# Patient Record
Sex: Female | Born: 1990 | Race: White | Hispanic: No | Marital: Married | Smoking: Never smoker
Health system: Southern US, Community
[De-identification: ages and names within clinical notes are randomized; demographics above are authoritative.]

## PROBLEM LIST (undated history)

## (undated) DIAGNOSIS — F32A Depression, unspecified: Secondary | ICD-10-CM

## (undated) DIAGNOSIS — R51 Headache: Secondary | ICD-10-CM

## (undated) DIAGNOSIS — K219 Gastro-esophageal reflux disease without esophagitis: Secondary | ICD-10-CM

## (undated) DIAGNOSIS — F419 Anxiety disorder, unspecified: Secondary | ICD-10-CM

## (undated) DIAGNOSIS — N83209 Unspecified ovarian cyst, unspecified side: Secondary | ICD-10-CM

## (undated) DIAGNOSIS — R112 Nausea with vomiting, unspecified: Secondary | ICD-10-CM

## (undated) DIAGNOSIS — Z9889 Other specified postprocedural states: Secondary | ICD-10-CM

## (undated) DIAGNOSIS — N809 Endometriosis, unspecified: Secondary | ICD-10-CM

## (undated) DIAGNOSIS — IMO0002 Reserved for concepts with insufficient information to code with codable children: Secondary | ICD-10-CM

## (undated) DIAGNOSIS — Z915 Personal history of self-harm: Secondary | ICD-10-CM

## (undated) DIAGNOSIS — F341 Dysthymic disorder: Secondary | ICD-10-CM

## (undated) DIAGNOSIS — R519 Headache, unspecified: Secondary | ICD-10-CM

## (undated) DIAGNOSIS — F329 Major depressive disorder, single episode, unspecified: Secondary | ICD-10-CM

## (undated) DIAGNOSIS — Z973 Presence of spectacles and contact lenses: Secondary | ICD-10-CM

## (undated) DIAGNOSIS — F909 Attention-deficit hyperactivity disorder, unspecified type: Secondary | ICD-10-CM

---
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
## 2017-01-10 ENCOUNTER — Emergency Department (HOSPITAL_BASED_OUTPATIENT_CLINIC_OR_DEPARTMENT_OTHER)
Admission: EM | Admit: 2017-01-10 | Discharge: 2017-01-11 | Disposition: A | Discharge status: Home / Self Care | Payer: 59 | Attending: Emergency Medicine | Admitting: Emergency Medicine

## 2017-01-10 ENCOUNTER — Encounter (HOSPITAL_BASED_OUTPATIENT_CLINIC_OR_DEPARTMENT_OTHER): Payer: Self-pay

## 2017-01-10 DIAGNOSIS — Z79899 Other long term (current) drug therapy: Secondary | ICD-10-CM | POA: Insufficient documentation

## 2017-01-10 DIAGNOSIS — R102 Pelvic and perineal pain: Secondary | ICD-10-CM | POA: Insufficient documentation

## 2017-01-10 DIAGNOSIS — F909 Attention-deficit hyperactivity disorder, unspecified type: Secondary | ICD-10-CM | POA: Insufficient documentation

## 2017-01-10 HISTORY — DX: Dysthymic disorder: F34.1

## 2017-01-10 HISTORY — DX: Unspecified ovarian cyst, unspecified side: N83.209

## 2017-01-10 HISTORY — DX: Attention-deficit hyperactivity disorder, unspecified type: F90.9

## 2017-01-10 HISTORY — DX: Major depressive disorder, single episode, unspecified: F32.9

## 2017-01-10 HISTORY — DX: Personal history of self-harm: Z91.5

## 2017-01-10 HISTORY — DX: Reserved for concepts with insufficient information to code with codable children: IMO0002

## 2017-01-10 HISTORY — DX: Depression, unspecified: F32.A

## 2017-01-10 LAB — WET PREP, GENITAL
Clue Cells Wet Prep HPF POC: NONE SEEN
Sperm: NONE SEEN
Trich, Wet Prep: NONE SEEN
Yeast Wet Prep HPF POC: NONE SEEN

## 2017-01-10 LAB — URINALYSIS, ROUTINE W REFLEX MICROSCOPIC
Bilirubin Urine: NEGATIVE
Glucose, UA: NEGATIVE mg/dL
Hgb urine dipstick: NEGATIVE
Ketones, ur: NEGATIVE mg/dL
Leukocytes, UA: NEGATIVE
Nitrite: NEGATIVE
Protein, ur: NEGATIVE mg/dL
Specific Gravity, Urine: 1.014 (ref 1.005–1.030)
pH: 6 (ref 5.0–8.0)

## 2017-01-10 LAB — PREGNANCY, URINE: Preg Test, Ur: NEGATIVE

## 2017-01-10 NOTE — ED Notes (Signed)
Pt c/o right groin/pelvic pain for the last few days.  She has a hx of ovarian cysts and states the pain is similar, but that she has not had the pain last for so long in the past.  Pt has an appointment with her OB/GYN on Friday.

## 2017-01-10 NOTE — ED Triage Notes (Signed)
C/o lower abd pain started yesterday-denies v/d-painful BM-NAD-steady gait

## 2017-01-10 NOTE — ED Provider Notes (Signed)
[facility] Provider Note: Lowella Dell, MD, FACEP  By signing my name below, I, Bing Neighbors., attest that this documentation has been prepared under the direction and in the presence of Paula Libra, MD. Electronically signed: Bing Neighbors., ED Scribe. 01/10/17. 11:48 PM.   CSN: [id number] MRN: [id number] ARRIVAL: 01/10/17 at 2137 ROOM: MH08/MH08   CHIEF COMPLAINT  Abdominal Pain   HISTORY OF PRESENT ILLNESS   Kelly Mckee is a 26 y.o. female with hx of ovarian cysts who presents to the Emergency Department complaining of mild to moderate right suprapubic abdominal pain with gradual onset since yesterday.She rates the pain 7/10 currently, worse with movement or palpation. Pt reports chills, decreased appetite and nausea. She denies diarrhea, fever, vaginal bleeding/discharge, dysuria and hematuria. She characterizes the pain is similar to previous ovarian cysts but more severe.   Past Medical History:  Diagnosis Date  . ADHD   . Depression   . Dysthymic disorder   . History of self injurious behavior   . Ovarian cyst     History reviewed. No pertinent surgical history.  No family history on file.  Social History  Substance Use Topics  . Smoking status: Never Smoker  . Smokeless tobacco: Never Used  . Alcohol use Yes     Comment: occ    Prior to Admission medications   Medication Sig Start Date End Date Taking? Authorizing Provider  FLUoxetine HCl (PROZAC PO) Take by mouth.   Yes Historical Provider, MD    Allergies Patient has no known allergies.   REVIEW OF SYSTEMS  Negative except as noted here or in the History of Present Illness.   PHYSICAL EXAMINATION  Initial Vital Signs Blood pressure 115/83, pulse 88, temperature 97.9 F (36.6 C), temperature source Oral, resp. rate 18, height 5\' 4"  (1.626 m), weight 155 lb (70.3 kg), SpO2 100 %.  Examination General: Well-developed, well-nourished female in no acute  distress; appearance consistent with age of record HENT: normocephalic; atraumatic Eyes: pupils equal, round and reactive to light; extraocular muscles intact Neck: supple Heart: regular rate and rhythm Lungs: clear to auscultation bilaterally Abdomen: soft; nondistended; nontender; no masses or hepatosplenomegaly; bowel sounds present; R sided abdominal tenderness, most prominent in the the right suprapubic region GU: Normal external genitalia; no vaginal discharge; no vaginal bleeding; right adnexal tenderness; cervical os closed Extremities: No deformity; full range of motion; pulses normal Neurologic: Awake, alert and oriented; motor function intact in all extremities and symmetric; no facial droop Skin: Warm and dry; facial acne. Psychiatric: Normal mood and affect   RESULTS  Summary of this visit's results, reviewed by myself:   EKG Interpretation  Date/Time:    Ventricular Rate:    PR Interval:    QRS Duration:   QT Interval:    QTC Calculation:   R Axis:     Text Interpretation:        Laboratory Studies: Results for orders placed or performed during the hospital encounter of 01/10/17 (from the past 24 hour(s))  Pregnancy, urine     Status: None   Collection Time: 01/10/17 10:10 PM  Result Value Ref Range   Preg Test, Ur NEGATIVE NEGATIVE  Urinalysis, Routine w reflex microscopic     Status: None   Collection Time: 01/10/17 10:10 PM  Result Value Ref Range   Color, Urine YELLOW YELLOW   APPearance CLEAR CLEAR   Specific Gravity, Urine 1.014 1.005 - 1.030   pH 6.0 5.0 - 8.0  Glucose, UA NEGATIVE NEGATIVE mg/dL   Hgb urine dipstick NEGATIVE NEGATIVE   Bilirubin Urine NEGATIVE NEGATIVE   Ketones, ur NEGATIVE NEGATIVE mg/dL   Protein, ur NEGATIVE NEGATIVE mg/dL   Nitrite NEGATIVE NEGATIVE   Leukocytes, UA NEGATIVE NEGATIVE  Wet prep, genital     Status: Abnormal   Collection Time: 01/10/17 11:15 PM  Result Value Ref Range   Yeast Wet Prep HPF POC NONE SEEN  NONE SEEN   Trich, Wet Prep NONE SEEN NONE SEEN   Clue Cells Wet Prep HPF POC NONE SEEN NONE SEEN   WBC, Wet Prep HPF POC MANY (A) NONE SEEN   Sperm NONE SEEN    Imaging Studies: No results found.  ED COURSE  Nursing notes and initial vitals signs, including pulse oximetry, reviewed.  Vitals:   01/10/17 2157  BP: 115/83  Pulse: 88  Resp: 18  Temp: 97.9 F (36.6 C)  TempSrc: Oral  SpO2: 100%  Weight: 155 lb (70.3 kg)  Height: 5\' 4"  (1.626 m)   11:46 PM The patient's exam is more consistent with an ovarian cyst than with an acute appendicitis. She was advised that acute appendicitis cannot be ruled out. She does have an appointment the day after tomorrow with her OB/GYN for a pelvic ultrasound to evaluate the for ovarian cyst. The risks and benefits of a CT scan were discussed. She would prefer not to have a CT scan at the present time given its high radiation load. We will discharge her home and have her return for worsening pain, migration of the pain to the right lower quadrant, fever or other worsening symptoms.  PROCEDURES    ED DIAGNOSES     ICD-9-CM ICD-10-CM   1. Pelvic pain in female 625.9 R10.2     I personally performed the services described in this documentation, which was scribed in my presence. The recorded information has been reviewed and is accurate.     Paula LibraJohn Deyon Chizek, MD 01/10/17 [phone]

## 2017-01-11 NOTE — ED Notes (Signed)
Gave pt strict return precautions and she verbalizes understanding of her instructions.  Pt to call her GYN tomorrow to update them on this visit.

## 2017-01-12 LAB — GC/CHLAMYDIA PROBE AMP (~~LOC~~) NOT AT ARMC
Chlamydia: NEGATIVE
Neisseria Gonorrhea: NEGATIVE

## 2017-10-22 ENCOUNTER — Other Ambulatory Visit (HOSPITAL_COMMUNITY)
Admission: RE | Admit: 2017-10-22 | Discharge: 2017-10-22 | Disposition: A | Discharge status: Home / Self Care | Payer: 59 | Source: Ambulatory Visit | Attending: Obstetrics and Gynecology | Admitting: Obstetrics and Gynecology

## 2017-10-22 ENCOUNTER — Other Ambulatory Visit: Payer: Self-pay | Admitting: Obstetrics and Gynecology

## 2017-10-22 DIAGNOSIS — Z124 Encounter for screening for malignant neoplasm of cervix: Secondary | ICD-10-CM | POA: Insufficient documentation

## 2017-10-24 LAB — CYTOLOGY - PAP
Chlamydia: NEGATIVE
DIAGNOSIS: NEGATIVE
Neisseria Gonorrhea: NEGATIVE

## 2018-03-27 ENCOUNTER — Ambulatory Visit (HOSPITAL_COMMUNITY): Admission: RE | Admit: 2018-03-27 | Payer: 59 | Source: Ambulatory Visit | Admitting: Obstetrics and Gynecology

## 2018-03-27 ENCOUNTER — Encounter (HOSPITAL_COMMUNITY): Admission: RE | Payer: Self-pay | Source: Ambulatory Visit

## 2018-03-27 SURGERY — LAPAROSCOPY, DIAGNOSTIC
Anesthesia: Choice

## 2018-11-05 ENCOUNTER — Ambulatory Visit: Payer: Self-pay | Admitting: General Surgery

## 2018-11-19 NOTE — Pre-Procedure Instructions (Signed)
Kelly Mckee  11/19/2018      [id number] [place], [place] - [address] AT [facility] OF [address] [place] [id number] Phone: [phone] Fax: [phone]    Your procedure is scheduled on November 28, 2018.  [facility] Admitting at 900 AM.  Call this number if you have problems the morning of surgery:  [phone]   Remember:  Do not eat or drink after midnight.  You may drink clear liquids until 800 AM.  Clear liquids allowed are:   Water, Juice (non-citric and without pulp), Clear Tea, Black Coffee only and Gatorade    Take these medicines the morning of surgery with A SIP OF WATER  [name] (effexor-XR)  7 days prior to surgery STOP taking any Aspirin (unless otherwise instructed by your surgeon), Aleve, Naproxen, Ibuprofen, Motrin, Advil, Goody's, BC's, all herbal medications, fish oil, and all vitamins   Do not wear jewelry, make-up or nail polish.  Do not wear lotions, powders, or perfumes, or deodorant.  Do not shave 48 hours prior to surgery.    Do not bring valuables to the hospital.  [facility] is not responsible for any belongings or valuables.  Contacts, dentures or bridgework may not be worn into surgery.  Leave your suitcase in the car.  After surgery it may be brought to your room.  For patients admitted to the hospital, discharge time will be determined by your treatment team.  Patients discharged the day of surgery will not be allowed to drive home.    [place]- Preparing For Surgery  Before surgery, you can play an important role. Because skin is not sterile, your skin needs to be as free of germs as possible. You can reduce the number of germs on your skin by washing with CHG (chlorahexidine gluconate) Soap before surgery.  CHG is an antiseptic cleaner which kills germs and bonds with the skin to continue killing germs even after washing.    Oral Hygiene is  also important to reduce your risk of infection.  Remember - BRUSH YOUR TEETH THE MORNING OF SURGERY WITH YOUR REGULAR TOOTHPASTE  Please do not use if you have an allergy to CHG or antibacterial soaps. If your skin becomes reddened/irritated stop using the CHG.  Do not shave (including legs and underarms) for at least 48 hours prior to first CHG shower. It is OK to shave your face.  Please follow these instructions carefully.   1. Shower the NIGHT BEFORE SURGERY and the MORNING OF SURGERY with CHG.   2. If you chose to wash your hair, wash your hair first as usual with your normal shampoo.  3. After you shampoo, rinse your hair and body thoroughly to remove the shampoo.  4. Use CHG as you would any other liquid soap. You can apply CHG directly to the skin and wash gently with a scrungie or a clean washcloth.   5. Apply the CHG Soap to your body ONLY FROM THE NECK DOWN.  Do not use on open wounds or open sores. Avoid contact with your eyes, ears, mouth and genitals (private parts). Wash Face and genitals (private parts)  with your normal soap.  6. Wash thoroughly, paying special attention to the area where your surgery will be performed.  7. Thoroughly rinse your body with warm water from the neck down.  8. DO NOT shower/wash with your normal soap after using and  rinsing off the CHG Soap.  9. Pat yourself dry with a CLEAN TOWEL.  10. Wear CLEAN PAJAMAS to bed the night before surgery, wear comfortable clothes the morning of surgery  11. Place CLEAN SHEETS on your bed the night of your first shower and DO NOT SLEEP WITH PETS.  Day of Surgery:  Do not apply any deodorants/lotions.  Please wear clean clothes to the hospital/surgery center.   Remember to brush your teeth WITH YOUR REGULAR TOOTHPASTE.  Please read over the following fact sheets that you were given. Pain Booklet, Coughing and Deep Breathing and Surgical Site Infection Prevention

## 2018-11-20 ENCOUNTER — Encounter (HOSPITAL_COMMUNITY)
Admission: RE | Admit: 2018-11-20 | Discharge: 2018-11-20 | Disposition: A | Discharge status: Home / Self Care | Payer: 59 | Source: Ambulatory Visit | Attending: General Surgery | Admitting: General Surgery

## 2018-11-20 ENCOUNTER — Encounter (HOSPITAL_COMMUNITY): Payer: Self-pay | Admitting: *Deleted

## 2018-11-20 DIAGNOSIS — Z01818 Encounter for other preprocedural examination: Secondary | ICD-10-CM | POA: Insufficient documentation

## 2018-11-20 DIAGNOSIS — K8080 Other cholelithiasis without obstruction: Secondary | ICD-10-CM | POA: Insufficient documentation

## 2018-11-20 HISTORY — DX: Other specified postprocedural states: Z98.890

## 2018-11-20 HISTORY — DX: Anxiety disorder, unspecified: F41.9

## 2018-11-20 HISTORY — DX: Gastro-esophageal reflux disease without esophagitis: K21.9

## 2018-11-20 HISTORY — DX: Headache: R51

## 2018-11-20 HISTORY — DX: Nausea with vomiting, unspecified: R11.2

## 2018-11-20 HISTORY — DX: Headache, unspecified: R51.9

## 2018-11-20 LAB — CBC
HCT: 40.5 % (ref 36.0–46.0)
Hemoglobin: 13.2 g/dL (ref 12.0–15.0)
MCH: 31.5 pg (ref 26.0–34.0)
MCHC: 32.6 g/dL (ref 30.0–36.0)
MCV: 96.7 fL (ref 80.0–100.0)
Platelets: 287 10*3/uL (ref 150–400)
RBC: 4.19 MIL/uL (ref 3.87–5.11)
RDW: 11.3 % — ABNORMAL LOW (ref 11.5–15.5)
WBC: 6.2 10*3/uL (ref 4.0–10.5)
nRBC: 0 % (ref 0.0–0.2)

## 2018-11-20 NOTE — Pre-Procedure Instructions (Signed)
Kelly Mckee  11/20/2018      [facility] #[id number] [place], [place] - [address] AT [facility] OF [address] [facility] [facility] [address] [place] [address] Phone: [phone] Fax: [phone]    Your procedure is scheduled on November 28, 2018.  Report to [facility] Admitting at 6:00 AM.  Call this number if you have problems the morning of surgery:  [phone]   Remember:  Do not eat or drink after midnight.  You may drink clear liquids until 5:00 AM.  Clear liquids allowed are:   Water, Juice (non-citric and without pulp), Clear Tea, Black Coffee only and Gatorade    Take these medicines the morning of surgery with A SIP OF WATER  Kelly Mckee (effexor-Mckee)  7 days prior to surgery STOP taking any Aspirin (unless otherwise instructed by your surgeon), Aleve, Naproxen, Ibuprofen, Motrin, Advil, Goody's, BC's, all herbal medications, fish oil, and all vitamins   Do not wear jewelry, make-up or nail polish.  Do not wear lotions, powders, or perfumes, or deodorant.  Do not shave 48 hours prior to surgery.    Do not bring valuables to the hospital.  [facility] is not responsible for any belongings or valuables.  Contacts, dentures or bridgework may not be worn into surgery.  Leave your suitcase in the car.  After surgery it may be brought to your room.  For patients admitted to the hospital, discharge time will be determined by your treatment team.  Patients discharged the day of surgery will not be allowed to drive home.    [place]- Preparing For Surgery  Before surgery, you can play an important role. Because skin is not sterile, your skin needs to be as free of germs as possible. You can reduce the number of germs on your skin by washing with CHG (chlorahexidine gluconate) Soap before surgery.  CHG is an antiseptic cleaner which kills germs and bonds with the skin to continue killing germs even after washing.    Oral Hygiene  is also important to reduce your risk of infection.  Remember - BRUSH YOUR TEETH THE MORNING OF SURGERY WITH YOUR REGULAR TOOTHPASTE  Please do not use if you have an allergy to CHG or antibacterial soaps. If your skin becomes reddened/irritated stop using the CHG.  Do not shave (including legs and underarms) for at least 48 hours prior to first CHG shower. It is OK to shave your face.  Please follow these instructions carefully.   1. Shower the NIGHT BEFORE SURGERY and the MORNING OF SURGERY with CHG.   2. If you chose to wash your hair, wash your hair first as usual with your normal shampoo.  3. After you shampoo, rinse your hair and body thoroughly to remove the shampoo.  4. Use CHG as you would any other liquid soap. You can apply CHG directly to the skin and wash gently with a scrungie or a clean washcloth.   5. Apply the CHG Soap to your body ONLY FROM THE NECK DOWN.  Do not use on open wounds or open sores. Avoid contact with your eyes, ears, mouth and genitals (private parts). Wash Face and genitals (private parts)  with your normal soap.  6. Wash thoroughly, paying special attention to the area where your surgery will be performed.  7. Thoroughly rinse your body with warm water from the neck down.  8. DO NOT shower/wash with your normal soap after using and  rinsing off the CHG Soap.  9. Pat yourself dry with a CLEAN TOWEL.  10. Wear CLEAN PAJAMAS to bed the night before surgery, wear comfortable clothes the morning of surgery  11. Place CLEAN SHEETS on your bed the night of your first shower and DO NOT SLEEP WITH PETS.  Day of Surgery:  Do not apply any deodorants/lotions.  Please wear clean clothes to the hospital/surgery center.   Remember to brush your teeth WITH YOUR REGULAR TOOTHPASTE.  Please read over the following fact sheets that you were given. Pain Booklet, Coughing and Deep Breathing and Surgical Site Infection Prevention

## 2018-11-20 NOTE — Progress Notes (Signed)
PCP: Dr. Herma [facility]Heidi Mandry @ [facility] in [place], [place]

## 2018-11-21 ENCOUNTER — Encounter (HOSPITAL_BASED_OUTPATIENT_CLINIC_OR_DEPARTMENT_OTHER): Payer: Self-pay

## 2018-11-27 ENCOUNTER — Encounter (HOSPITAL_COMMUNITY): Payer: Self-pay | Admitting: Anesthesiology

## 2018-11-27 NOTE — Anesthesia Preprocedure Evaluation (Addendum)
Anesthesia Evaluation  Patient identified by MRN, date of birth, ID band Patient awake    Reviewed: Allergy & Precautions, NPO status , Patient's Chart, lab work & pertinent test results  History of Anesthesia Complications (+) PONV and history of anesthetic complications  Airway Mallampati: I       Dental no notable dental hx. (+) Teeth Intact   Pulmonary neg pulmonary ROS,    Pulmonary exam normal breath sounds clear to auscultation       Cardiovascular negative cardio ROS Normal cardiovascular exam Rhythm:Regular Rate:Normal     Neuro/Psych PSYCHIATRIC DISORDERS Anxiety Depression    GI/Hepatic   Endo/Other  negative endocrine ROS  Renal/GU negative Renal ROS     Musculoskeletal negative musculoskeletal ROS (+)   Abdominal Normal abdominal exam  (+)   Peds  Hematology negative hematology ROS (+)   Anesthesia Other Findings   Reproductive/Obstetrics                            Anesthesia Physical Anesthesia Plan  ASA: II  Anesthesia Plan: General   Post-op Pain Management:    Induction: Intravenous  PONV Risk Score and Plan: 4 or greater and Ondansetron, Dexamethasone, Midazolam and Scopolamine patch - Pre-op  Airway Management Planned: Oral ETT  Additional Equipment:   Intra-op Plan:   Post-operative Plan: Extubation in OR  Informed Consent: I have reviewed the patients History and Physical, chart, labs and discussed the procedure including the risks, benefits and alternatives for the proposed anesthesia with the patient or authorized representative who has indicated his/her understanding and acceptance.     Plan Discussed with: CRNA  Anesthesia Plan Comments:        Anesthesia Quick Evaluation

## 2018-11-28 ENCOUNTER — Ambulatory Visit (HOSPITAL_COMMUNITY): Payer: 59 | Admitting: Registered Nurse

## 2018-11-28 ENCOUNTER — Encounter (HOSPITAL_COMMUNITY)
Admission: RE | Disposition: A | Discharge status: Home / Self Care | Payer: Self-pay | Source: Ambulatory Visit | Attending: General Surgery

## 2018-11-28 ENCOUNTER — Encounter (HOSPITAL_COMMUNITY): Payer: Self-pay

## 2018-11-28 ENCOUNTER — Ambulatory Visit (HOSPITAL_COMMUNITY)
Admission: RE | Admit: 2018-11-28 | Discharge: 2018-11-28 | Disposition: A | Discharge status: Home / Self Care | Payer: 59 | Source: Ambulatory Visit | Attending: General Surgery | Admitting: General Surgery

## 2018-11-28 ENCOUNTER — Other Ambulatory Visit: Payer: Self-pay

## 2018-11-28 ENCOUNTER — Ambulatory Visit (HOSPITAL_COMMUNITY): Payer: 59

## 2018-11-28 DIAGNOSIS — Z800 Family history of malignant neoplasm of digestive organs: Secondary | ICD-10-CM | POA: Insufficient documentation

## 2018-11-28 DIAGNOSIS — Z820 Family history of epilepsy and other diseases of the nervous system: Secondary | ICD-10-CM | POA: Insufficient documentation

## 2018-11-28 DIAGNOSIS — Z8371 Family history of colonic polyps: Secondary | ICD-10-CM | POA: Diagnosis not present

## 2018-11-28 DIAGNOSIS — K8010 Calculus of gallbladder with chronic cholecystitis without obstruction: Secondary | ICD-10-CM | POA: Diagnosis not present

## 2018-11-28 DIAGNOSIS — F419 Anxiety disorder, unspecified: Secondary | ICD-10-CM | POA: Insufficient documentation

## 2018-11-28 DIAGNOSIS — Z818 Family history of other mental and behavioral disorders: Secondary | ICD-10-CM | POA: Diagnosis not present

## 2018-11-28 DIAGNOSIS — F329 Major depressive disorder, single episode, unspecified: Secondary | ICD-10-CM | POA: Diagnosis not present

## 2018-11-28 DIAGNOSIS — E7800 Pure hypercholesterolemia, unspecified: Secondary | ICD-10-CM | POA: Diagnosis not present

## 2018-11-28 DIAGNOSIS — Z833 Family history of diabetes mellitus: Secondary | ICD-10-CM | POA: Diagnosis not present

## 2018-11-28 DIAGNOSIS — K219 Gastro-esophageal reflux disease without esophagitis: Secondary | ICD-10-CM | POA: Diagnosis not present

## 2018-11-28 DIAGNOSIS — Z79899 Other long term (current) drug therapy: Secondary | ICD-10-CM | POA: Diagnosis not present

## 2018-11-28 DIAGNOSIS — Z419 Encounter for procedure for purposes other than remedying health state, unspecified: Secondary | ICD-10-CM

## 2018-11-28 DIAGNOSIS — K8020 Calculus of gallbladder without cholecystitis without obstruction: Secondary | ICD-10-CM | POA: Diagnosis present

## 2018-11-28 HISTORY — PX: CHOLECYSTECTOMY: SHX55

## 2018-11-28 LAB — POCT PREGNANCY, URINE: Preg Test, Ur: NEGATIVE

## 2018-11-28 SURGERY — LAPAROSCOPIC CHOLECYSTECTOMY WITH INTRAOPERATIVE CHOLANGIOGRAM
Anesthesia: General | Site: Abdomen

## 2018-11-28 MED ORDER — DEXAMETHASONE SODIUM PHOSPHATE 10 MG/ML IJ SOLN
INTRAMUSCULAR | Status: AC
  Filled 2018-11-28: qty 1

## 2018-11-28 MED ORDER — ROCURONIUM BROMIDE 50 MG/5ML IV SOSY
PREFILLED_SYRINGE | INTRAVENOUS | Status: AC
  Filled 2018-11-28: qty 5

## 2018-11-28 MED ORDER — HYDROCODONE-ACETAMINOPHEN 5-325 MG PO TABS: 1.0000 | ORAL_TABLET | Freq: Four times a day (QID) | ORAL | 0 refills | Status: DC | PRN

## 2018-11-28 MED ORDER — PROPOFOL 10 MG/ML IV BOLUS
INTRAVENOUS | Status: AC
Start: 2018-11-28 — End: 2018-11-28
  Filled 2018-11-28: qty 20

## 2018-11-28 MED ORDER — SCOPOLAMINE 1 MG/3DAYS TD PT72
MEDICATED_PATCH | TRANSDERMAL | Status: DC | PRN
  Administered 2018-11-28: 1 via TRANSDERMAL

## 2018-11-28 MED ORDER — BUPIVACAINE-EPINEPHRINE 0.25% -1:200000 IJ SOLN
INTRAMUSCULAR | Status: DC | PRN
  Administered 2018-11-28: 30 mL

## 2018-11-28 MED ORDER — SCOPOLAMINE 1 MG/3DAYS TD PT72
MEDICATED_PATCH | TRANSDERMAL | Status: AC
  Filled 2018-11-28: qty 1

## 2018-11-28 MED ORDER — ONDANSETRON HCL 4 MG/2ML IJ SOLN
INTRAMUSCULAR | Status: DC | PRN
  Administered 2018-11-28: 4 mg via INTRAVENOUS

## 2018-11-28 MED ORDER — BUPIVACAINE-EPINEPHRINE (PF) 0.25% -1:200000 IJ SOLN
INTRAMUSCULAR | Status: AC
  Filled 2018-11-28: qty 30

## 2018-11-28 MED ORDER — IOPAMIDOL (ISOVUE-300) INJECTION 61%
INTRAVENOUS | Status: AC
  Filled 2018-11-28: qty 50

## 2018-11-28 MED ORDER — FENTANYL CITRATE (PF) 250 MCG/5ML IJ SOLN
INTRAMUSCULAR | Status: AC
  Filled 2018-11-28: qty 5

## 2018-11-28 MED ORDER — LIDOCAINE 2% (20 MG/ML) 5 ML SYRINGE
INTRAMUSCULAR | Status: AC
  Filled 2018-11-28: qty 5

## 2018-11-28 MED ORDER — MIDAZOLAM HCL 2 MG/2ML IJ SOLN
INTRAMUSCULAR | Status: AC
  Filled 2018-11-28: qty 2

## 2018-11-28 MED ORDER — DEXAMETHASONE SODIUM PHOSPHATE 10 MG/ML IJ SOLN
INTRAMUSCULAR | Status: DC | PRN
  Administered 2018-11-28: 10 mg via INTRAVENOUS

## 2018-11-28 MED ORDER — ROCURONIUM BROMIDE 50 MG/5ML IV SOSY
PREFILLED_SYRINGE | INTRAVENOUS | Status: DC | PRN
  Administered 2018-11-28: 40 mg via INTRAVENOUS
  Administered 2018-11-28 (×2): 10 mg via INTRAVENOUS

## 2018-11-28 MED ORDER — SODIUM CHLORIDE 0.9 % IR SOLN
Status: DC | PRN
  Administered 2018-11-28: 1

## 2018-11-28 MED ORDER — FENTANYL CITRATE (PF) 100 MCG/2ML IJ SOLN
INTRAMUSCULAR | Status: DC | PRN
  Administered 2018-11-28 (×2): 25 ug via INTRAVENOUS
  Administered 2018-11-28: 150 ug via INTRAVENOUS
  Administered 2018-11-28: 50 ug via INTRAVENOUS

## 2018-11-28 MED ORDER — LACTATED RINGERS IV SOLN
INTRAVENOUS | Status: DC
  Administered 2018-11-28 (×2): via INTRAVENOUS

## 2018-11-28 MED ORDER — CELECOXIB 200 MG PO CAPS
200.0000 mg | ORAL_CAPSULE | ORAL | Status: AC
  Administered 2018-11-28: 200 mg via ORAL
  Filled 2018-11-28: qty 1

## 2018-11-28 MED ORDER — CHLORHEXIDINE GLUCONATE CLOTH 2 % EX PADS: 6.0000 | MEDICATED_PAD | Freq: Once | CUTANEOUS | Status: DC

## 2018-11-28 MED ORDER — SODIUM CHLORIDE 0.9 % IV SOLN
INTRAVENOUS | Status: DC | PRN
  Administered 2018-11-28: 100 mL

## 2018-11-28 MED ORDER — SUGAMMADEX SODIUM 200 MG/2ML IV SOLN
INTRAVENOUS | Status: DC | PRN
  Administered 2018-11-28 (×2): 100 mg via INTRAVENOUS

## 2018-11-28 MED ORDER — ONDANSETRON HCL 4 MG/2ML IJ SOLN
INTRAMUSCULAR | Status: AC
  Filled 2018-11-28: qty 2

## 2018-11-28 MED ORDER — 0.9 % SODIUM CHLORIDE (POUR BTL) OPTIME
TOPICAL | Status: DC | PRN
  Administered 2018-11-28: 1000 mL

## 2018-11-28 MED ORDER — LIDOCAINE 2% (20 MG/ML) 5 ML SYRINGE
INTRAMUSCULAR | Status: DC | PRN
  Administered 2018-11-28: 100 mg via INTRAVENOUS

## 2018-11-28 MED ORDER — GABAPENTIN 300 MG PO CAPS
300.0000 mg | ORAL_CAPSULE | ORAL | Status: AC
  Administered 2018-11-28: 300 mg via ORAL
  Filled 2018-11-28: qty 1

## 2018-11-28 MED ORDER — MIDAZOLAM HCL 5 MG/5ML IJ SOLN
INTRAMUSCULAR | Status: DC | PRN
  Administered 2018-11-28: 2 mg via INTRAVENOUS

## 2018-11-28 MED ORDER — PHENYLEPHRINE 40 MCG/ML (10ML) SYRINGE FOR IV PUSH (FOR BLOOD PRESSURE SUPPORT)
PREFILLED_SYRINGE | INTRAVENOUS | Status: AC
  Filled 2018-11-28: qty 10

## 2018-11-28 MED ORDER — CEFAZOLIN SODIUM-DEXTROSE 2-4 GM/100ML-% IV SOLN
2.0000 g | INTRAVENOUS | Status: AC
  Administered 2018-11-28: 2 g via INTRAVENOUS
  Filled 2018-11-28: qty 100

## 2018-11-28 MED ORDER — ACETAMINOPHEN 500 MG PO TABS
1000.0000 mg | ORAL_TABLET | ORAL | Status: AC
  Administered 2018-11-28: 1000 mg via ORAL
  Filled 2018-11-28: qty 2

## 2018-11-28 MED ORDER — PROPOFOL 10 MG/ML IV BOLUS
INTRAVENOUS | Status: DC | PRN
  Administered 2018-11-28: 150 mg via INTRAVENOUS

## 2018-11-28 SURGICAL SUPPLY — 40 items
APPLIER CLIP 5 13 M/L LIGAMAX5 (MISCELLANEOUS) ×3
BLADE CLIPPER SURG (BLADE) IMPLANT
CANISTER SUCT 3000ML PPV (MISCELLANEOUS) ×3 IMPLANT
CATH REDDICK CHOLANGI 4FR 50CM (CATHETERS) ×3 IMPLANT
CHLORAPREP W/TINT 26ML (MISCELLANEOUS) ×3 IMPLANT
CLIP APPLIE 5 13 M/L LIGAMAX5 (MISCELLANEOUS) ×1 IMPLANT
COVER MAYO STAND STRL (DRAPES) ×3 IMPLANT
COVER SURGICAL LIGHT HANDLE (MISCELLANEOUS) ×3 IMPLANT
COVER WAND RF STERILE (DRAPES) ×3 IMPLANT
DERMABOND ADVANCED (GAUZE/BANDAGES/DRESSINGS) ×2
DERMABOND ADVANCED .7 DNX12 (GAUZE/BANDAGES/DRESSINGS) ×1 IMPLANT
DRAPE C-ARM 42X72 X-RAY (DRAPES) ×3 IMPLANT
ELECT REM PT RETURN 9FT ADLT (ELECTROSURGICAL) ×3
ELECTRODE REM PT RTRN 9FT ADLT (ELECTROSURGICAL) ×1 IMPLANT
GLOVE BIO SURGEON STRL SZ7.5 (GLOVE) ×3 IMPLANT
GLOVE BIOGEL PI IND STRL 7.5 (GLOVE) ×1 IMPLANT
GLOVE BIOGEL PI IND STRL 8 (GLOVE) ×1 IMPLANT
GLOVE BIOGEL PI INDICATOR 7.5 (GLOVE) ×2
GLOVE BIOGEL PI INDICATOR 8 (GLOVE) ×2
GLOVE ECLIPSE 7.5 STRL STRAW (GLOVE) ×3 IMPLANT
GOWN STRL REUS W/ TWL LRG LVL3 (GOWN DISPOSABLE) ×3 IMPLANT
GOWN STRL REUS W/TWL LRG LVL3 (GOWN DISPOSABLE) ×6
IV CATH 14GX2 1/4 (CATHETERS) ×3 IMPLANT
KIT BASIN OR (CUSTOM PROCEDURE TRAY) ×3 IMPLANT
KIT TURNOVER KIT B (KITS) ×3 IMPLANT
NS IRRIG 1000ML POUR BTL (IV SOLUTION) ×3 IMPLANT
PAD ARMBOARD 7.5X6 YLW CONV (MISCELLANEOUS) ×3 IMPLANT
POUCH SPECIMEN RETRIEVAL 10MM (ENDOMECHANICALS) ×3 IMPLANT
SCISSORS LAP 5X35 DISP (ENDOMECHANICALS) ×3 IMPLANT
SET IRRIG TUBING LAPAROSCOPIC (IRRIGATION / IRRIGATOR) ×3 IMPLANT
SLEEVE ENDOPATH XCEL 5M (ENDOMECHANICALS) ×6 IMPLANT
SPECIMEN JAR SMALL (MISCELLANEOUS) ×3 IMPLANT
SUT MNCRL AB 4-0 PS2 18 (SUTURE) ×3 IMPLANT
TOWEL OR 17X24 6PK STRL BLUE (TOWEL DISPOSABLE) ×3 IMPLANT
TOWEL OR 17X26 10 PK STRL BLUE (TOWEL DISPOSABLE) ×3 IMPLANT
TRAY LAPAROSCOPIC MC (CUSTOM PROCEDURE TRAY) ×3 IMPLANT
TROCAR XCEL BLUNT TIP 100MML (ENDOMECHANICALS) ×3 IMPLANT
TROCAR XCEL NON-BLD 5MMX100MML (ENDOMECHANICALS) ×3 IMPLANT
TUBING INSUFFLATION (TUBING) ×3 IMPLANT
WATER STERILE IRR 1000ML POUR (IV SOLUTION) ×3 IMPLANT

## 2018-11-28 NOTE — H&P (Signed)
Kelly Mckee  [facility] Patient #: [id number] DOB: 12/24/1990 Married / Language: English / Race: White Female   History of Present Illness  The patient is a 27 year old female who presents with abdominal pain. We are asked to see the patient in consultation by Dr. Herma CarsonHeidi Mandry to evaluate her for gallstones. The patient is a 27 year old white female who presents with abdominal pain for the last 6 months or so. The pain seems to come and go. It radiates into her back. The pain has been associated with nausea. She has had only a couple episodes of vomiting. The worst episode was about 3 weeks ago. An u/s showed stones in the gallbladder but no wall thickening or ductal dilation. She is otherwise in good health   Past Surgical History No pertinent past surgical history   Diagnostic Studies History  Colonoscopy  never Mammogram  never Pap Smear  1-5 years ago  Allergies No Known Drug Allergies   Medication History  Ashlyna (0.15-0.03 &0.01MG  Tablet, Oral) Active. Venlafaxine HCl ER (37.5MG  Tablet ER 24HR, Oral) Active. Medications Reconciled  Social History  Alcohol use  Moderate alcohol use. Caffeine use  Carbonated beverages, Tea. No drug use  Tobacco use  Never smoker.  Family History Colon Cancer  Father. Colon Polyps  Father. Depression  Mother, Sister. Diabetes Mellitus  Father, Mother. Seizure disorder  Sister.  Pregnancy / Birth History Age at menarche  12 years. Contraceptive History  Oral contraceptives. Gravida  0 Regular periods   Other Problems  Anxiety Disorder  Cholelithiasis  Depression  Gastroesophageal Reflux Disease  Hypercholesterolemia  Migraine Headache     Review of Systems  General Present- Appetite Loss and Night Sweats. Not Present- Chills, Fatigue, Fever, Weight Gain and Weight Loss. Skin Not Present- Change in Wart/Mole, Dryness, Hives, Jaundice, New Lesions, Non-Healing Wounds,  Rash and Ulcer. HEENT Present- Wears glasses/contact lenses. Not Present- Earache, Hearing Loss, Hoarseness, Nose Bleed, Oral Ulcers, Ringing in the Ears, Seasonal Allergies, Sinus Pain, Sore Throat, Visual Disturbances and Yellow Eyes. Gastrointestinal Present- Abdominal Pain, Bloating, Indigestion and Nausea. Not Present- Bloody Stool, Change in Bowel Habits, Chronic diarrhea, Constipation, Difficulty Swallowing, Excessive gas, Gets full quickly at meals, Hemorrhoids, Rectal Pain and Vomiting. Female Genitourinary Not Present- Frequency, Nocturia, Painful Urination, Pelvic Pain and Urgency. Neurological Present- Headaches. Not Present- Decreased Memory, Fainting, Numbness, Seizures, Tingling, Tremor, Trouble walking and Weakness. Psychiatric Present- Anxiety. Not Present- Bipolar, Change in Sleep Pattern, Depression, Fearful and Frequent crying. Endocrine Not Present- Cold Intolerance, Excessive Hunger, Hair Changes, Heat Intolerance, Hot flashes and New Diabetes. Hematology Not Present- Blood Thinners, Easy Bruising, Excessive bleeding, Gland problems, HIV and Persistent Infections.  Vitals Weight: 157.25 lb Height: 64in Body Surface Area: 1.77 m Body Mass Index: 26.99 kg/m  Temp.: 98.34F(Oral)  Pulse: 99 (Regular)  BP: 118/78 (Sitting, Left Arm, Standard)       Physical Exam General Mental Status-Alert. General Appearance-Consistent with stated age. Hydration-Well hydrated. Voice-Normal.  Head and Neck Head-normocephalic, atraumatic with no lesions or palpable masses. Trachea-midline. Thyroid Gland Characteristics - normal size and consistency.  Eye Eyeball - Bilateral-Extraocular movements intact. Sclera/Conjunctiva - Bilateral-No scleral icterus.  Chest and Lung Exam Chest and lung exam reveals -quiet, even and easy respiratory effort with no use of accessory muscles and on auscultation, normal breath sounds, no adventitious sounds and  normal vocal resonance. Inspection Chest Wall - Normal. Back - normal.  Cardiovascular Cardiovascular examination reveals -normal heart sounds, regular rate and rhythm  with no murmurs and normal pedal pulses bilaterally.  Abdomen Inspection Inspection of the abdomen reveals - No Hernias. Skin - Scar - no surgical scars. Palpation/Percussion Palpation and Percussion of the abdomen reveal - Soft, Non Tender, No Rebound tenderness, No Rigidity (guarding) and No hepatosplenomegaly. Auscultation Auscultation of the abdomen reveals - Bowel sounds normal.  Neurologic Neurologic evaluation reveals -alert and oriented x 3 with no impairment of recent or remote memory. Mental Status-Normal.  Musculoskeletal Normal Exam - Left-Upper Extremity Strength Normal and Lower Extremity Strength Normal. Normal Exam - Right-Upper Extremity Strength Normal and Lower Extremity Strength Normal.  Lymphatic Head & Neck  General Head & Neck Lymphatics: Bilateral - Description - Normal. Axillary  General Axillary Region: Bilateral - Description - Normal. Tenderness - Non Tender. Femoral & Inguinal  Generalized Femoral & Inguinal Lymphatics: Bilateral - Description - Normal. Tenderness - Non Tender.    Assessment & Plan  GALLSTONES (K80.20) Impression: The patient appears to have symptomatic gallstones. Because of the risk of further painful episodes and possible pancreatitis or think she would benefit from having her gallbladder removed. She would also like to have this done. I have discussed with her in detail the risks and benefits of the operation as well as some of the technical aspects and she understands and wishes to proceed. I will plan for a laparoscopic cholecystectomy with intraoperative cholangiogram. Current Plans Pt Education - Gallstones: discussed with patient and provided information.

## 2018-11-28 NOTE — Anesthesia Postprocedure Evaluation (Signed)
Anesthesia Post Note  Patient: Kelly Mckee  Procedure(s) Performed: LAPAROSCOPIC CHOLECYSTECTOMY WITH INTRAOPERATIVE CHOLANGIOGRAM (N/A Abdomen)     Patient location during evaluation: PACU Anesthesia Type: General Level of consciousness: awake Pain management: pain level controlled Vital Signs Assessment: post-procedure vital signs reviewed and stable Respiratory status: spontaneous breathing Cardiovascular status: stable Postop Assessment: no apparent nausea or vomiting Anesthetic complications: no    Last Vitals:  Vitals:   11/28/18 1051 11/28/18 1115  BP: 111/84 112/78  Pulse: 81 77  Resp: 16 20  Temp: 36.5 C 36.5 C  SpO2: 100% 98%    Last Pain:  Vitals:   11/28/18 1051  TempSrc:   PainSc: Asleep   Pain Goal: Patients Stated Pain Goal: 2 (11/28/18 1029)               Caren MacadamJohn F Tavie Haseman Jr

## 2018-11-28 NOTE — Anesthesia Procedure Notes (Signed)
Procedure Name: Intubation Date/Time: 11/28/2018 8:27 AM Performed by: Trinna Post., CRNA Pre-anesthesia Checklist: Patient identified, Emergency Drugs available, Suction available, Patient being monitored and Timeout performed Patient Re-evaluated:Patient Re-evaluated prior to induction Oxygen Delivery Method: Circle system utilized Preoxygenation: Pre-oxygenation with 100% oxygen Induction Type: IV induction Ventilation: Mask ventilation without difficulty Laryngoscope Size: Mac and 3 Grade View: Grade I Tube type: Oral Tube size: 7.0 mm Number of attempts: 1 Airway Equipment and Method: Stylet Placement Confirmation: ETT inserted through vocal cords under direct vision,  positive ETCO2 and breath sounds checked- equal and bilateral Secured at: 22 cm Tube secured with: Tape Dental Injury: Teeth and Oropharynx as per pre-operative assessment

## 2018-11-28 NOTE — Op Note (Signed)
11/28/2018  9:48 AM  PATIENT:  Kelly Mckee  27 y.o. female  PRE-OPERATIVE DIAGNOSIS:  GALLSTONES  POST-OPERATIVE DIAGNOSIS:  GALLSTONES  PROCEDURE:  Procedure(s): LAPAROSCOPIC CHOLECYSTECTOMY WITH INTRAOPERATIVE CHOLANGIOGRAM (N/A)  SURGEON:  Surgeon(s) and Role:    * Griselda Mineroth, Paul III, MD - Primary  PHYSICIAN ASSISTANT:   ASSISTANTS: none   ANESTHESIA:   local and general  EBL:  15 mL   BLOOD ADMINISTERED:none  DRAINS: none   LOCAL MEDICATIONS USED:  MARCAINE     SPECIMEN:  Source of Specimen:  gallbladder  DISPOSITION OF SPECIMEN:  PATHOLOGY  COUNTS:  YES  TOURNIQUET:  * No tourniquets in log *  DICTATION: .Dragon Dictation     Procedure: After informed consent was obtained the patient was brought to the operating room and placed in the supine position on the operating room table. After adequate induction of general anesthesia the patient's abdomen was prepped with ChloraPrep allowed to dry and draped in usual sterile manner. An appropriate timeout was performed. The area below the umbilicus was infiltrated with quarter percent  Marcaine. A small incision was made with a 15 blade knife. The incision was carried down through the subcutaneous tissue bluntly with a hemostat and [employer] retractors. The linea alba was identified. The linea alba was incised with a 15 blade knife and each side was grasped with Coker clamps. The preperitoneal space was then probed with a hemostat until the peritoneum was opened and access was gained to the abdominal cavity. A 0 Vicryl pursestring stitch was placed in the fascia surrounding the opening. A Hassan cannula was then placed through the opening and anchored in place with the previously placed Vicryl purse string stitch. The abdomen was insufflated with carbon dioxide without difficulty. A laparoscope was inserted through the [facility]ula in the right upper quadrant was inspected. Next the epigastric region was infiltrated with %  Marcaine. A small incision was made with a 15 blade knife. A 5 mm port was placed bluntly through this incision into the abdominal cavity under direct vision. Next 2 sites were chosen laterally on the right side of the abdomen for placement of 5 mm ports. Each of these areas was infiltrated with quarter percent Marcaine. Small stab incisions were made with a 15 blade knife. 5 mm ports were then placed bluntly through these incisions into the abdominal cavity under direct vision without difficulty. A blunt grasper was placed through the lateralmost 5 mm port and used to grasp the dome of the gallbladder and elevated anteriorly and superiorly. Another blunt grasper was placed through the other 5 mm port and used to retract the body and neck of the gallbladder. A dissector was placed through the epigastric port and using the electrocautery the peritoneal reflection at the gallbladder neck was opened. Blunt dissection was then carried out in this area until the gallbladder neck-cystic duct junction was readily identified and a good window was created. A single clip was placed on the gallbladder neck. A small  ductotomy was made just below the clip with laparoscopic scissors. A 14-gauge Angiocath was then placed through the anterior abdominal wall under direct vision. A Reddick cholangiogram catheter was then placed through the Angiocath and flushed. The catheter was then placed in the cystic duct and anchored in place with a clip. A cholangiogram was obtained that showed no filling defects good emptying into the duodenum an adequate length on the cystic duct. The anchoring clip and catheters were then removed from the patient. 3 clips were  placed proximally on the cystic duct and the duct was divided between the 2 sets of clips. Posterior to this the cystic artery was identified and again dissected bluntly in a circumferential manner until a good window  was created. 2 clips were placed proximally and one distally on  the artery and the artery was divided between the 2 sets of clips. Next a laparoscopic hook cautery device was used to separate the gallbladder from the liver bed. Prior to completely detaching the gallbladder from the liver bed the liver bed was inspected and several small bleeding points were coagulated with the electrocautery until the area was completely hemostatic. The gallbladder was then detached the rest of it from the liver bed without difficulty. A laparoscopic bag was inserted through the hassan port. The laparoscope was moved to the epigastric port. The gallbladder was placed within the bag and the bag was sealed.  The bag with the gallbladder was then removed with the [facility] cannula through the infraumbilical port without difficulty. The fascial defect was then closed with the previously placed Vicryl pursestring stitch as well as with another figure-of-eight 0 Vicryl stitch. The liver bed was inspected again and found to be hemostatic. The abdomen was irrigated with copious amounts of saline until the effluent was clear. The ports were then removed under direct vision without difficulty and were found to be hemostatic. The gas was allowed to escape. The skin incisions were all closed with interrupted 4-0 Monocryl subcuticular stitches. Dermabond dressings were applied. The patient tolerated the procedure well. At the end of the case all needle sponge and instrument counts were correct. The patient was then awakened and taken to recovery in stable condition  PLAN OF CARE: Discharge to home after PACU  PATIENT DISPOSITION:  PACU - hemodynamically stable.   Delay start of Pharmacological VTE agent (>24hrs) due to surgical blood loss or risk of bleeding: not applicable

## 2018-11-28 NOTE — Interval H&P Note (Signed)
History and Physical Interval Note:  11/28/2018 8:05 AM  Kelly Mckee  has presented today for surgery, with the diagnosis of GALLSTONES  The various methods of treatment have been discussed with the patient and family. After consideration of risks, benefits and other options for treatment, the patient has consented to  Procedure(s): LAPAROSCOPIC CHOLECYSTECTOMY WITH INTRAOPERATIVE CHOLANGIOGRAM (N/A) as a surgical intervention .  The patient's history has been reviewed, patient examined, no change in status, stable for surgery.  I have reviewed the patient's chart and labs.  Questions were answered to the patient's satisfaction.     Chevis PrettyPaul Toth III

## 2018-11-28 NOTE — Transfer of Care (Signed)
Immediate Anesthesia Transfer of Care Note  Patient: Kelly Mckee  Procedure(s) Performed: LAPAROSCOPIC CHOLECYSTECTOMY WITH INTRAOPERATIVE CHOLANGIOGRAM (N/A )  Patient Location: PACU  Anesthesia Type:General  Level of Consciousness: awake, alert  and oriented  Airway & Oxygen Therapy: Patient Spontanous Breathing and Patient connected to nasal cannula oxygen  Post-op Assessment: Report given to RN and Post -op Vital signs reviewed and stable  Post vital signs: Reviewed and stable  Last Vitals:  Vitals Value Taken Time  BP    Temp    Pulse 101 11/28/2018  9:58 AM  Resp 11 11/28/2018  9:58 AM  SpO2 98 % 11/28/2018  9:58 AM  Vitals shown include unvalidated device data.  Last Pain:  Vitals:   11/28/18 0649  TempSrc:   PainSc: 5       Patients Stated Pain Goal: 2 (11/28/18 [id number])  Complications: No apparent anesthesia complications

## 2018-11-29 ENCOUNTER — Encounter (HOSPITAL_COMMUNITY): Payer: Self-pay | Admitting: General Surgery

## 2019-01-02 ENCOUNTER — Other Ambulatory Visit (HOSPITAL_COMMUNITY)
Admission: RE | Admit: 2019-01-02 | Discharge: 2019-01-02 | Disposition: A | Discharge status: Home / Self Care | Payer: 59 | Source: Ambulatory Visit | Attending: Obstetrics and Gynecology | Admitting: Obstetrics and Gynecology

## 2019-01-02 ENCOUNTER — Other Ambulatory Visit: Payer: Self-pay | Admitting: Obstetrics and Gynecology

## 2019-01-02 DIAGNOSIS — Z01419 Encounter for gynecological examination (general) (routine) without abnormal findings: Secondary | ICD-10-CM | POA: Insufficient documentation

## 2019-01-06 LAB — CYTOLOGY - PAP: Diagnosis: NEGATIVE

## 2020-04-28 ENCOUNTER — Other Ambulatory Visit: Payer: Self-pay | Admitting: Obstetrics and Gynecology

## 2020-04-28 DIAGNOSIS — R102 Pelvic and perineal pain: Secondary | ICD-10-CM

## 2020-05-03 IMAGING — RF DG CHOLANGIOGRAM OPERATIVE
1 series · 4 of 4 positions shown · non-contrast
Comparison: None.

CLINICAL DATA: Gallstones

EXAM:
INTRAOPERATIVE CHOLANGIOGRAM
TECHNIQUE: Cholangiographic images from the C-arm fluoroscopic device were
submitted for interpretation post-operatively. Please see the
procedural report for the amount of contrast and the fluoroscopy
time utilized.

[Series 1: unknown protocol · 0.20mm/px · 4 of 58 frames shown]
[frame 9/58]
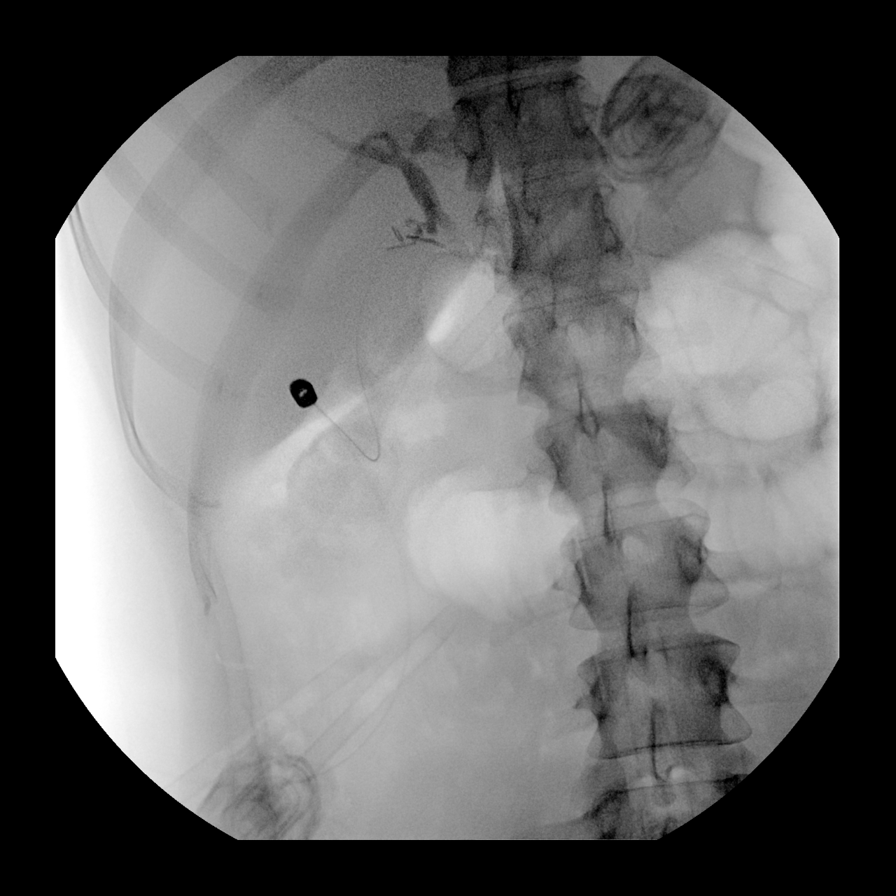
[frame 30/58]
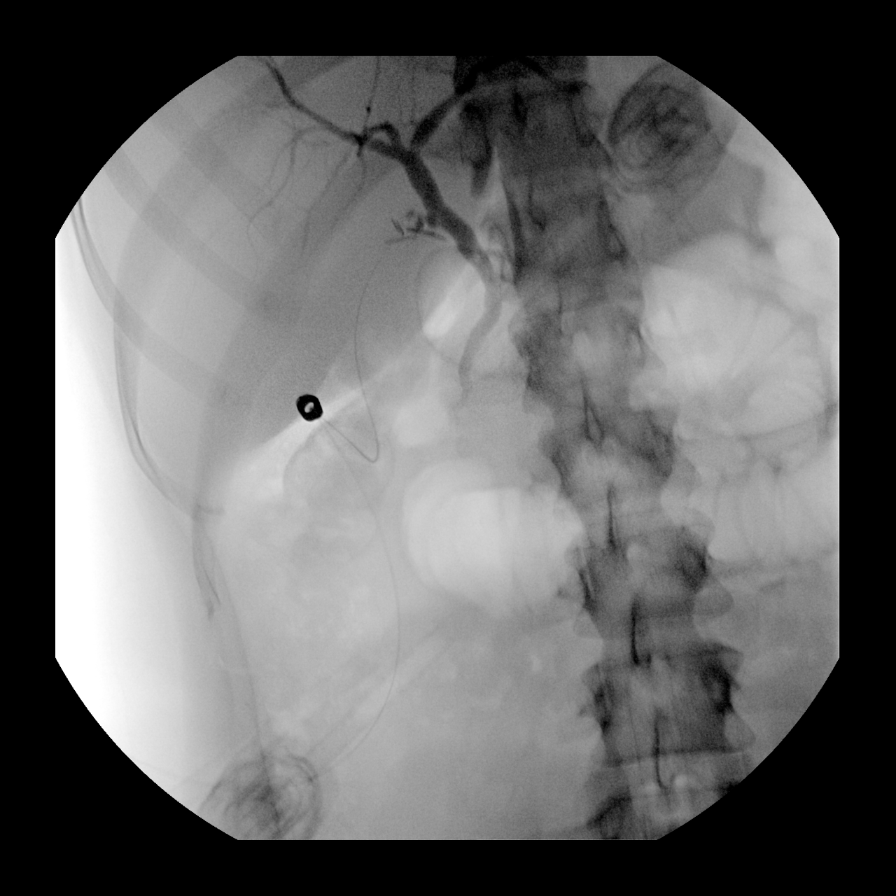
[frame 39/58]
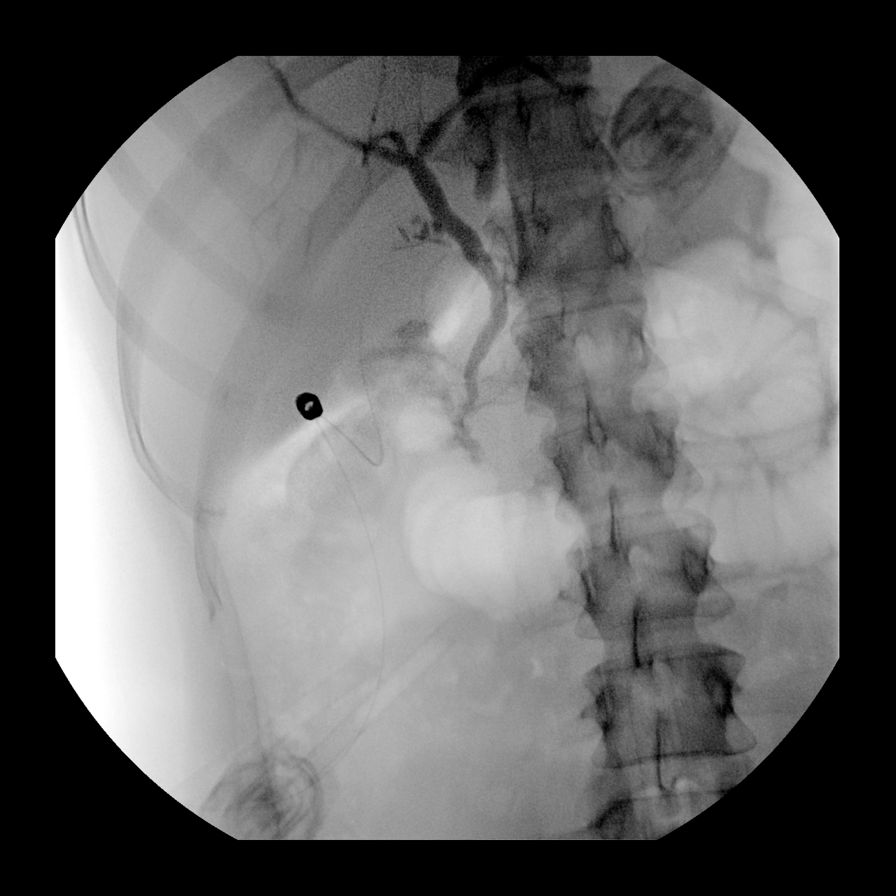
[frame 50/58]
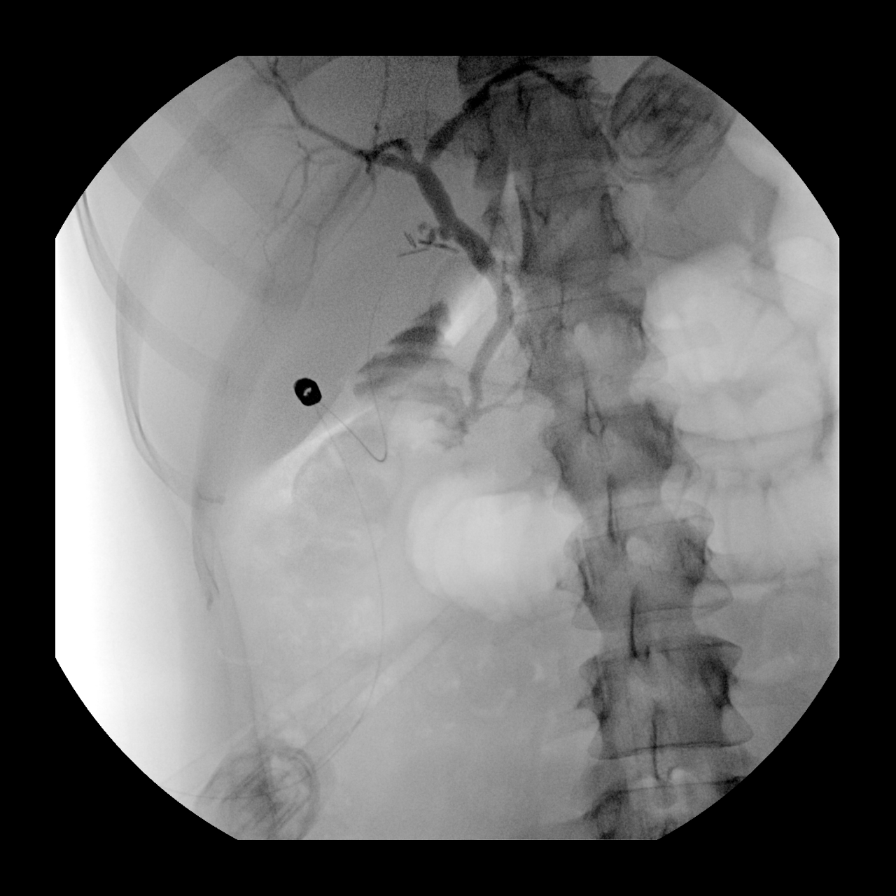

[4 of 4 positions shown; findings below may reference images not displayed]

FINDINGS: Contrast fills the duodenum and biliary tree without filling defects
in the common bile duct.
IMPRESSION: Patent biliary tree.

## 2020-05-06 ENCOUNTER — Other Ambulatory Visit: Payer: Self-pay

## 2020-05-17 ENCOUNTER — Ambulatory Visit
Admission: RE | Admit: 2020-05-17 | Discharge: 2020-05-17 | Disposition: A | Discharge status: Home / Self Care | Payer: PRIVATE HEALTH INSURANCE | Source: Ambulatory Visit | Attending: Obstetrics and Gynecology | Admitting: Obstetrics and Gynecology

## 2020-05-17 DIAGNOSIS — R102 Pelvic and perineal pain: Secondary | ICD-10-CM

## 2020-09-24 ENCOUNTER — Other Ambulatory Visit: Payer: Self-pay

## 2020-09-24 ENCOUNTER — Encounter (HOSPITAL_BASED_OUTPATIENT_CLINIC_OR_DEPARTMENT_OTHER): Payer: Self-pay | Admitting: Obstetrics and Gynecology

## 2020-09-25 ENCOUNTER — Inpatient Hospital Stay (HOSPITAL_COMMUNITY): Admission: RE | Admit: 2020-09-25 | Payer: PRIVATE HEALTH INSURANCE | Source: Ambulatory Visit

## 2020-09-27 ENCOUNTER — Other Ambulatory Visit (HOSPITAL_COMMUNITY)
Admission: RE | Admit: 2020-09-27 | Discharge: 2020-09-27 | Disposition: A | Discharge status: Home / Self Care | Payer: 59 | Source: Ambulatory Visit | Attending: Obstetrics and Gynecology | Admitting: Obstetrics and Gynecology

## 2020-09-27 DIAGNOSIS — Z20822 Contact with and (suspected) exposure to covid-19: Secondary | ICD-10-CM | POA: Insufficient documentation

## 2020-09-27 DIAGNOSIS — Z01812 Encounter for preprocedural laboratory examination: Secondary | ICD-10-CM | POA: Insufficient documentation

## 2020-09-27 LAB — SARS CORONAVIRUS 2 (TAT 6-24 HRS): SARS Coronavirus 2: NEGATIVE

## 2020-09-28 ENCOUNTER — Other Ambulatory Visit: Payer: Self-pay | Admitting: Obstetrics and Gynecology

## 2020-09-28 NOTE — H&P (Deleted)
  The note originally documented on this encounter has been moved the the encounter in which it belongs.  

## 2020-09-28 NOTE — H&P (Signed)
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  Does patient report new onset of COVID symptoms?  No.  Has patient or close contact tested positive for COVID-19?  No , not in the past 2 weeks.         General          29 yo presents for pre-op visit.            Pt is scheduled for diagnostic laparoscopy on Sep 29, 2020 to evaluate for endometriosis of pelvis or abdomen.            Pt contacted office on Apr 19, 2020 to discuss scheduling diagnostic laparoscopy for endometriosis of pelvis or abdomen. She reported sxs of lower abdominal and back pain w/ intense bloating. She was last seen May 20, 2020 c/o continued pain during her period and especially during intercourse.            U/S performed at [facility] on May 17, 2020 revealed uterus measuring 6.6 x 3.8 x 3.7 cm. Endometrium measured 4 mm. Bilat OV WNL.            Today, pt reports her menses has been irregular since starting Loestrin 1/20. She endorses darker blood during her period and has random spotting for about 1-2 days.            Pelvic exam normal.     Current Medication:      Taking   Loestrin 1/20 (21)(Norethindrone Acet-Ethinyl Est) 1-20 MG-MCG Tablet 1 tablet Orally Once a day.      Nystatin-Triamcinolone 100000-0.1 UNIT/GM-% Cream 1 application to affected area Externally Twice a day prn.      Venlafaxine HCl 75 MG Tablet 1 tablet with food Orally Once a day.      Wellbutrin(buPROPion HCl).         Discontinued   Seasonique(Levonorgest-Eth Estrad 91-Day) 0.15-0.03 &0.01 MG Tablet 1 tablet Orally Once a day.      FLUoxetine HCl 40 MG Capsule 1 capsule Orally Once a day.      Doxycycline Hyclate 100 MG Capsule 1 capsule Orally Once a day, Notes: acne.      Medication  List reviewed and reconciled with the patient.      Medical History:   Anxiety/Depression      Acne        Allergies/Intolerance:      latex - itchy      Gyn History:   Sexual activity currently sexually active.   Periods :  irregular.   LMP 09/14/2020.   Birth control ocps.   Last pap smear date 02/06/2020-negative.   Denies Last mammogram date N/A.   Denies Abnormal pap smear.   Denies STD.   Menarche 12/13.        OB History:   Never been pregnant  per patient.        Surgical History:   cholecystectomy       Hospitalization:   No Hospitalization History.       Family History:   Father: alive, colon cancer ~ 11, type II DM, diagnosed with Colon cancer    Mother: deceased, anxiety/depression,type II DM, diagnosed with Diabetes    Paternal Grand Father: alive,  Parkinsons    Paternal Grand Mother: alive, stroke    Maternal Grand Father: deceased, cancer unknown    Maternal Grand Mother: deceased, lupus    Sister 1: alive, epilepsy younger-seizure-grew out of it    1 sister(s) .          denies any GYN family cancer hx.     Social History:       General         Tobacco use cigarettes:  Never smoked, Tobacco history last updated  09/16/2020, Vaping  No.           Alcohol: yes, social.           no Recreational drug use.           Marital Status: married.           Children: none.           OCCUPATION: employed, Oceanographer for [employer]      ROS:       CONSTITUTIONAL         Chills  No.  Fatigue  No.  Fever  No.  Night sweats  No.  Recent travel outside [place]  No.  Sweats  No.  Weight change  No.         OPHTHALMOLOGY         Blurring of vision  no.  Change in vision  no.  Double vision  no.         ENT         Dizziness  no.  Nose bleeds  no.  Sore throat  no.  Teeth pain  no.         ALLERGY         Hives  no.         CARDIOLOGY         Chest pain  no.  High blood pressure  no.  Irregular heart beat  no.  Leg edema  no.  Palpitations  no.          RESPIRATORY         Shortness of breath  no.  Cough  no.  Wheezing  no.         UROLOGY         Pain with urination  no.  Urinary urgency  no.  Urinary frequency  no.  Urinary incontinence  no.  Difficulty urinating  No.  Blood in urine  No.         GASTROENTEROLOGY         Abdominal pain  no.  Appetite change  no.  Bloating/belching  no.  Blood in stool or on toilet paper  no.  Change in bowel movements  no.  Constipation  no.  Diarrhea  no.  Difficulty swallowing  no.  Nausea  no.         FEMALE REPRODUCTIVE         Vulvar pain  no.  Vulvar rash  no.  Abnormal vaginal bleeding  no.  Breast pain  no.  Nipple discharge  no.  Pain with intercourse  yes.  Pelvic pain  worse with menstruation.  Unusual vaginal discharge  no.  Vaginal itching  no.         MUSCULOSKELETAL         Muscle aches  no.         NEUROLOGY         Headache  no.  Tingling/numbness  no.  Weakness  no.         PSYCHOLOGY         Depression  no.  Anxiety  no.  Nervousness  no.  Sleep disturbances  no.  Suicidal ideation  no .         ENDOCRINOLOGY         Excessive thirst  no.  Excessive urination  no.  Hair loss  no.  Heat or cold intolerance  no.         HEMATOLOGY/LYMPH         Abnormal bleeding  no.  Easy bruising  no.  Swollen glands  no.         DERMATOLOGY         New/changing skin lesion  no.  Rash  no.  Sores  no.            Negative except as stated in HPI.   Objective:    Vitals:        Wt 170.8, Wt change 3.6 lb, Ht 64.75, BMI 28.64, Pulse sitting 102, BP sitting 106/68.     Past Results:    Examination:          General Examination         CONSTITUTIONAL: alert, oriented, NAD .          SKIN: moist, warm.          EYES: Conjunctiva clear.          LUNGS: good I:E efffort noted, CTA bilat.          HEART: RRR.          ABDOMEN: soft, non-tender/non-distended, bowel sounds present .          FEMALE GENITOURINARY: normal external genitalia, labia - unremarkable, vagina - pink moist mucosa, no  lesions or abnormal discharge, cervix - no discharge or lesions or CMT, adnexa - no masses or tenderness, uterus - nontender and normal size on palpation .          PSYCH: affect normal, good eye contact.      Physical Examination:    Assessment:     Assessment:    Pelvic pain - R10.2 (Primary)      Dysmenorrhea - N94.6        Plan:    Treatment:      Pelvic pain          Notes: Pt is scheduled for diagnostic laparoscopy on Sep 29, 2020 to evaluate for endometriosis of pelvis or abdomen. Pt advised she will be able to return home the same day. Discussed risks of laparoscopy including but not limited to infection, bleeding, damage to her bowel or ureters with the need for further surgery. Discussed risk of blood transfusion and risk of HIV or hep B&C (1 out of 2 million and 1 out of 200,000, respectively) with blood transfusion. Pt is aware of risks and desires blood transfusion if needed. Pt advised to avoid NSAIDs (Asprin, Aleve, Advil, Ibuprofen, Motrin) from now until surgery given risk of bleeding during surgery. She may take Tylenol for pain management. She is advised to avoid eating or drinking starting midnight prior to surgery. Discussed post-surgery avoidance of driving for 3 days and avoidance of lifting weight greater than 10 lbs or intercourse for 2 weeks after procedure. Follow up 2 wks after surgery for post-op visit.      Dysmenorrhea          Notes: Pt is scheduled for diagnostic   laparoscopy on Sep 29, 2020 to evaluate for endometriosis of pelvis or abdomen. Follow up for 2 wk post-op visit.Marland Kitchen

## 2020-09-29 ENCOUNTER — Other Ambulatory Visit: Payer: Self-pay

## 2020-09-29 ENCOUNTER — Encounter (HOSPITAL_BASED_OUTPATIENT_CLINIC_OR_DEPARTMENT_OTHER)
Admission: RE | Disposition: A | Discharge status: Home / Self Care | Payer: Self-pay | Source: Home / Self Care | Attending: Obstetrics and Gynecology

## 2020-09-29 ENCOUNTER — Ambulatory Visit (HOSPITAL_COMMUNITY)
Admission: RE | Admit: 2020-09-29 | Discharge: 2020-09-29 | Disposition: A | Discharge status: Home / Self Care | Payer: 59 | Attending: Obstetrics and Gynecology | Admitting: Obstetrics and Gynecology

## 2020-09-29 ENCOUNTER — Ambulatory Visit (HOSPITAL_BASED_OUTPATIENT_CLINIC_OR_DEPARTMENT_OTHER): Payer: 59 | Admitting: Certified Registered Nurse Anesthetist

## 2020-09-29 ENCOUNTER — Encounter (HOSPITAL_BASED_OUTPATIENT_CLINIC_OR_DEPARTMENT_OTHER): Payer: Self-pay | Admitting: Obstetrics and Gynecology

## 2020-09-29 DIAGNOSIS — N946 Dysmenorrhea, unspecified: Secondary | ICD-10-CM | POA: Diagnosis present

## 2020-09-29 DIAGNOSIS — R102 Pelvic and perineal pain: Secondary | ICD-10-CM | POA: Insufficient documentation

## 2020-09-29 DIAGNOSIS — N838 Other noninflammatory disorders of ovary, fallopian tube and broad ligament: Secondary | ICD-10-CM | POA: Diagnosis not present

## 2020-09-29 HISTORY — PX: LAPAROSCOPY: SHX197

## 2020-09-29 LAB — POCT PREGNANCY, URINE: Preg Test, Ur: NEGATIVE

## 2020-09-29 SURGERY — LAPAROSCOPY, DIAGNOSTIC
Anesthesia: General | Site: Abdomen

## 2020-09-29 MED ORDER — ONDANSETRON HCL 4 MG/2ML IJ SOLN
INTRAMUSCULAR | Status: DC | PRN
  Administered 2020-09-29: 4 mg via INTRAVENOUS

## 2020-09-29 MED ORDER — FENTANYL CITRATE (PF) 100 MCG/2ML IJ SOLN
INTRAMUSCULAR | Status: AC
  Filled 2020-09-29: qty 2

## 2020-09-29 MED ORDER — PROPOFOL 10 MG/ML IV BOLUS
INTRAVENOUS | Status: DC | PRN
  Administered 2020-09-29: 150 mg via INTRAVENOUS

## 2020-09-29 MED ORDER — OXYCODONE HCL 5 MG PO TABS
5.0000 mg | ORAL_TABLET | Freq: Once | ORAL | Status: AC
  Administered 2020-09-29: 5 mg via ORAL

## 2020-09-29 MED ORDER — PROMETHAZINE HCL 25 MG/ML IJ SOLN: 6.2500 mg | INTRAMUSCULAR | Status: DC | PRN

## 2020-09-29 MED ORDER — FENTANYL CITRATE (PF) 100 MCG/2ML IJ SOLN
INTRAMUSCULAR | Status: DC | PRN
  Administered 2020-09-29 (×2): 50 ug via INTRAVENOUS

## 2020-09-29 MED ORDER — DEXAMETHASONE SODIUM PHOSPHATE 10 MG/ML IJ SOLN
INTRAMUSCULAR | Status: AC
  Filled 2020-09-29: qty 1

## 2020-09-29 MED ORDER — ROCURONIUM BROMIDE 100 MG/10ML IV SOLN
INTRAVENOUS | Status: DC | PRN
  Administered 2020-09-29: 50 mg via INTRAVENOUS

## 2020-09-29 MED ORDER — BUPIVACAINE HCL (PF) 0.25 % IJ SOLN
INTRAMUSCULAR | Status: DC | PRN
  Administered 2020-09-29: 30 mL

## 2020-09-29 MED ORDER — CEFAZOLIN SODIUM-DEXTROSE 2-4 GM/100ML-% IV SOLN
2.0000 g | INTRAVENOUS | Status: AC
  Administered 2020-09-29: 2 g via INTRAVENOUS

## 2020-09-29 MED ORDER — LIDOCAINE HCL (CARDIAC) PF 100 MG/5ML IV SOSY
PREFILLED_SYRINGE | INTRAVENOUS | Status: DC | PRN
  Administered 2020-09-29: 80 mg via INTRAVENOUS

## 2020-09-29 MED ORDER — ACETAMINOPHEN 500 MG PO TABS
1000.0000 mg | ORAL_TABLET | ORAL | Status: AC
  Administered 2020-09-29: 1000 mg via ORAL

## 2020-09-29 MED ORDER — CELECOXIB 200 MG PO CAPS
400.0000 mg | ORAL_CAPSULE | ORAL | Status: AC
  Administered 2020-09-29: 400 mg via ORAL

## 2020-09-29 MED ORDER — IBUPROFEN 800 MG PO TABS: 800.0000 mg | ORAL_TABLET | Freq: Three times a day (TID) | ORAL | 0 refills | Status: DC | PRN

## 2020-09-29 MED ORDER — LACTATED RINGERS IV SOLN: INTRAVENOUS | Status: DC

## 2020-09-29 MED ORDER — KETOROLAC TROMETHAMINE 30 MG/ML IJ SOLN
INTRAMUSCULAR | Status: AC
  Filled 2020-09-29: qty 1

## 2020-09-29 MED ORDER — CEFAZOLIN SODIUM-DEXTROSE 2-4 GM/100ML-% IV SOLN
INTRAVENOUS | Status: AC
  Filled 2020-09-29: qty 100

## 2020-09-29 MED ORDER — CELECOXIB 200 MG PO CAPS
ORAL_CAPSULE | ORAL | Status: AC
  Filled 2020-09-29: qty 2

## 2020-09-29 MED ORDER — POVIDONE-IODINE 10 % EX SWAB
2.0000 "application " | Freq: Once | CUTANEOUS | Status: AC
  Administered 2020-09-29: 2 via TOPICAL

## 2020-09-29 MED ORDER — PHENYLEPHRINE HCL (PRESSORS) 10 MG/ML IV SOLN
INTRAVENOUS | Status: DC | PRN
  Administered 2020-09-29 (×3): 80 ug via INTRAVENOUS

## 2020-09-29 MED ORDER — PROPOFOL 10 MG/ML IV BOLUS
INTRAVENOUS | Status: AC
  Filled 2020-09-29: qty 20

## 2020-09-29 MED ORDER — DEXAMETHASONE SODIUM PHOSPHATE 4 MG/ML IJ SOLN
INTRAMUSCULAR | Status: DC | PRN
  Administered 2020-09-29: 10 mg via INTRAVENOUS

## 2020-09-29 MED ORDER — FENTANYL CITRATE (PF) 100 MCG/2ML IJ SOLN: 25.0000 ug | INTRAMUSCULAR | Status: DC | PRN

## 2020-09-29 MED ORDER — SCOPOLAMINE 1 MG/3DAYS TD PT72
1.0000 | MEDICATED_PATCH | Freq: Once | TRANSDERMAL | Status: DC
  Administered 2020-09-29: 1.5 mg via TRANSDERMAL

## 2020-09-29 MED ORDER — BUPIVACAINE HCL (PF) 0.25 % IJ SOLN
INTRAMUSCULAR | Status: AC
  Filled 2020-09-29: qty 30

## 2020-09-29 MED ORDER — OXYCODONE HCL 5 MG PO TABS
ORAL_TABLET | ORAL | Status: AC
  Filled 2020-09-29: qty 1

## 2020-09-29 MED ORDER — ACETAMINOPHEN 500 MG PO TABS: 1000.0000 mg | ORAL_TABLET | Freq: Once | ORAL | Status: DC

## 2020-09-29 MED ORDER — PROPOFOL 500 MG/50ML IV EMUL
INTRAVENOUS | Status: DC | PRN
  Administered 2020-09-29: 25 ug/kg/min via INTRAVENOUS

## 2020-09-29 MED ORDER — ROCURONIUM BROMIDE 10 MG/ML (PF) SYRINGE
PREFILLED_SYRINGE | INTRAVENOUS | Status: AC
  Filled 2020-09-29: qty 10

## 2020-09-29 MED ORDER — MIDAZOLAM HCL 2 MG/2ML IJ SOLN
INTRAMUSCULAR | Status: AC
  Filled 2020-09-29: qty 2

## 2020-09-29 MED ORDER — LIDOCAINE 2% (20 MG/ML) 5 ML SYRINGE
INTRAMUSCULAR | Status: AC
  Filled 2020-09-29: qty 5

## 2020-09-29 MED ORDER — PROPOFOL 500 MG/50ML IV EMUL
INTRAVENOUS | Status: AC
  Filled 2020-09-29: qty 50

## 2020-09-29 MED ORDER — MIDAZOLAM HCL 5 MG/5ML IJ SOLN
INTRAMUSCULAR | Status: DC | PRN
  Administered 2020-09-29: 2 mg via INTRAVENOUS

## 2020-09-29 MED ORDER — SUGAMMADEX SODIUM 200 MG/2ML IV SOLN
INTRAVENOUS | Status: DC | PRN
  Administered 2020-09-29: 200 mg via INTRAVENOUS

## 2020-09-29 MED ORDER — SCOPOLAMINE 1 MG/3DAYS TD PT72
MEDICATED_PATCH | TRANSDERMAL | Status: AC
  Filled 2020-09-29: qty 1

## 2020-09-29 MED ORDER — ONDANSETRON HCL 4 MG/2ML IJ SOLN
INTRAMUSCULAR | Status: AC
  Filled 2020-09-29: qty 2

## 2020-09-29 MED ORDER — ACETAMINOPHEN 500 MG PO TABS
ORAL_TABLET | ORAL | Status: AC
  Filled 2020-09-29: qty 2

## 2020-09-29 SURGICAL SUPPLY — 33 items
BAG SPEC RTRVL LRG 6X4 10 (ENDOMECHANICALS)
CABLE HIGH FREQUENCY MONO STRZ (ELECTRODE) ×3 IMPLANT
COVER WAND RF STERILE (DRAPES) ×3 IMPLANT
DRSG OPSITE POSTOP 3X4 (GAUZE/BANDAGES/DRESSINGS) IMPLANT
GLOVE BIOGEL M 6.5 STRL (GLOVE) ×6 IMPLANT
GLOVE BIOGEL PI IND STRL 6.5 (GLOVE) ×1 IMPLANT
GLOVE BIOGEL PI IND STRL 7.0 (GLOVE) ×2 IMPLANT
GLOVE BIOGEL PI INDICATOR 6.5 (GLOVE) ×2
GLOVE BIOGEL PI INDICATOR 7.0 (GLOVE) ×4
GOWN STRL REUS W/ TWL LRG LVL3 (GOWN DISPOSABLE) ×2 IMPLANT
GOWN STRL REUS W/TWL LRG LVL3 (GOWN DISPOSABLE) ×6
KIT TURNOVER KIT B (KITS) ×3 IMPLANT
NS IRRIG 1000ML POUR BTL (IV SOLUTION) ×3 IMPLANT
PACK LAPAROSCOPY BASIN (CUSTOM PROCEDURE TRAY) ×3 IMPLANT
PACK TRENDGUARD 450 HYBRID PRO (MISCELLANEOUS) ×1 IMPLANT
POUCH SPECIMEN RETRIEVAL 10MM (ENDOMECHANICALS) IMPLANT
SEALER TISSUE G2 CVD JAW 35 (ENDOMECHANICALS) IMPLANT
SEALER TISSUE G2 CVD JAW 45CM (ENDOMECHANICALS)
SET IRRIG TUBING LAPAROSCOPIC (IRRIGATION / IRRIGATOR) IMPLANT
SET TUBE SMOKE EVAC HIGH FLOW (TUBING) ×3 IMPLANT
SHEARS HARMONIC ACE PLUS 36CM (ENDOMECHANICALS) IMPLANT
SLEEVE ENDOPATH XCEL 5M (ENDOMECHANICALS) ×6 IMPLANT
SOLUTION ELECTROLUBE (MISCELLANEOUS) IMPLANT
SUT VIC AB 4-0 PS2 27 (SUTURE) ×3 IMPLANT
SUT VICRYL 0 UR6 27IN ABS (SUTURE) IMPLANT
SYR 20ML LL LF (SYRINGE) ×3 IMPLANT
TOWEL GREEN STERILE FF (TOWEL DISPOSABLE) ×6 IMPLANT
TRAY FOL W/BAG SLVR 16FR STRL (SET/KITS/TRAYS/PACK) ×1 IMPLANT
TRAY FOLEY W/BAG SLVR 16FR LF (SET/KITS/TRAYS/PACK) ×3
TRENDGUARD 450 HYBRID PRO PACK (MISCELLANEOUS) ×3
TROCAR XCEL NON-BLD 11X100MML (ENDOMECHANICALS) IMPLANT
TROCAR XCEL NON-BLD 5MMX100MML (ENDOMECHANICALS) ×3 IMPLANT
WARMER LAPAROSCOPE (MISCELLANEOUS) ×3 IMPLANT

## 2020-09-29 NOTE — Discharge Instructions (Signed)

## 2020-09-29 NOTE — Transfer of Care (Signed)
Immediate Anesthesia Transfer of Care Note  Patient: Kelly Mckee  Procedure(s) Performed: LAPAROSCOPY DIAGNOSTIC WITH BIOPSY OF RT  PELVIC SIDE WALL AND REMOVAL OF PARATUBAL CYST (N/A Abdomen)  Patient Location: PACU  Anesthesia Type:General  Level of Consciousness: awake and alert   Airway & Oxygen Therapy: Patient Spontanous Breathing and Patient connected to nasal cannula oxygen  Post-op Assessment: Report given to RN and Post -op Vital signs reviewed and stable  Post vital signs: Reviewed and stable  Last Vitals:  Vitals Value Taken Time  BP 106/74 09/29/20 0943  Temp    Pulse 59 09/29/20 0943  Resp 30 09/29/20 0944  SpO2 81 % 09/29/20 0943  Vitals shown include unvalidated device data.  Last Pain:  Vitals:   09/29/20 0732  TempSrc: Oral  PainSc: 3       Patients Stated Pain Goal: 5 (09/29/20 0732)  Complications: No complications documented.

## 2020-09-29 NOTE — Anesthesia Procedure Notes (Signed)
Procedure Name: Intubation Date/Time: 09/29/2020 8:40 AM Performed by: Cleda Clarks, CRNA Pre-anesthesia Checklist: Patient identified, Emergency Drugs available, Suction available and Patient being monitored Patient Re-evaluated:Patient Re-evaluated prior to induction Oxygen Delivery Method: Circle system utilized Preoxygenation: Pre-oxygenation with 100% oxygen Induction Type: IV induction Ventilation: Mask ventilation without difficulty Laryngoscope Size: Miller and 2 Grade View: Grade II Tube type: Oral Tube size: 7.0 mm Number of attempts: 1 Airway Equipment and Method: Stylet and Oral airway Placement Confirmation: ETT inserted through vocal cords under direct vision,  positive ETCO2 and breath sounds checked- equal and bilateral Secured at: 21 cm Tube secured with: Tape Dental Injury: Teeth and Oropharynx as per pre-operative assessment

## 2020-09-29 NOTE — Anesthesia Preprocedure Evaluation (Addendum)
Anesthesia Evaluation  Patient identified by MRN, date of birth, ID band Patient awake    Reviewed: Allergy & Precautions, NPO status , Patient's Chart, lab work & pertinent test results  History of Anesthesia Complications (+) PONV and history of anesthetic complications  Airway Mallampati: I  TM Distance: >3 FB Neck ROM: Full    Dental  (+) Teeth Intact   Pulmonary neg pulmonary ROS,    Pulmonary exam normal breath sounds clear to auscultation       Cardiovascular negative cardio ROS Normal cardiovascular exam Rhythm:Regular Rate:Normal     Neuro/Psych  Headaches, PSYCHIATRIC DISORDERS Anxiety Depression    GI/Hepatic Neg liver ROS, GERD  ,  Endo/Other  negative endocrine ROS  Renal/GU negative Renal ROS     Musculoskeletal negative musculoskeletal ROS (+)   Abdominal   Peds  (+) ADHD Hematology negative hematology ROS (+)   Anesthesia Other Findings Day of surgery medications reviewed with the patient.  Reproductive/Obstetrics Pelvic pain                             Anesthesia Physical Anesthesia Plan  ASA: II  Anesthesia Plan: General   Post-op Pain Management:    Induction: Intravenous  PONV Risk Score and Plan: 4 or greater and Scopolamine patch - Pre-op, Midazolam, Dexamethasone, Ondansetron and Propofol infusion  Airway Management Planned: Oral ETT  Additional Equipment:   Intra-op Plan:   Post-operative Plan: Extubation in OR  Informed Consent: I have reviewed the patients History and Physical, chart, labs and discussed the procedure including the risks, benefits and alternatives for the proposed anesthesia with the patient or authorized representative who has indicated his/her understanding and acceptance.       Plan Discussed with: CRNA  Anesthesia Plan Comments:         Anesthesia Quick Evaluation

## 2020-09-29 NOTE — Anesthesia Postprocedure Evaluation (Signed)
Anesthesia Post Note  Patient: Kelly Mckee  Procedure(s) Performed: LAPAROSCOPY DIAGNOSTIC WITH BIOPSY OF RT  PELVIC SIDE WALL AND REMOVAL OF PARATUBAL CYST (N/A Abdomen)     Patient location during evaluation: PACU Anesthesia Type: General Level of consciousness: awake and alert Pain management: pain level controlled Vital Signs Assessment: post-procedure vital signs reviewed and stable Respiratory status: spontaneous breathing, nonlabored ventilation and respiratory function stable Cardiovascular status: blood pressure returned to baseline and stable Postop Assessment: no apparent nausea or vomiting Anesthetic complications: no   No complications documented.  Last Vitals:  Vitals:   09/29/20 1000 09/29/20 1050  BP: (!) 117/92 106/84  Pulse: (!) 112 (!) 106  Resp: (!) 27 18  Temp:  36.6 C  SpO2: 96% 98%    Last Pain:  Vitals:   09/29/20 1050  TempSrc:   PainSc: 3                  Cecile Hearing

## 2020-09-29 NOTE — Op Note (Signed)
09/29/2020  9:33 AM  PATIENT:  Kelly Mckee  29 y.o. female  PRE-OPERATIVE DIAGNOSIS:  N94.6-Dysmenorrhea R10.2-Pelvic pain  POST-OPERATIVE DIAGNOSIS:  dysmenorrhea pelvic pain  PROCEDURE:  Procedure(s): LAPAROSCOPY DIAGNOSTIC (N/A)  Fulgaration of endometriosis. / removal of left paratubal cyst  SURGEON:  Surgeon(s) and Role:    Gerald Leitz, MD - Primary  PHYSICIAN ASSISTANT:None   ASSISTANTS: Karmen Stabs RNFA   ANESTHESIA:   general  EBL:  Less than 5 cc   BLOOD ADMINISTERED:none  DRAINS: none   LOCAL MEDICATIONS USED:  MARCAINE     SPECIMEN:  Source of Specimen:  suspected implant of endometriosis from the right pelvic side wall  and left paratubal cyst   DISPOSITION OF SPECIMEN:  PATHOLOGY  COUNTS:  YES  TOURNIQUET:  * No tourniquets in log *  DICTATION: .Dragon Dictation  PLAN OF CARE: Discharge to home after PACU  PATIENT DISPOSITION:  PACU - hemodynamically stable.   Delay start of Pharmacological VTE agent (>24hrs) due to surgical blood loss or risk of bleeding: not applicable  Findings: one implant of suspected endometriosis on the right pelvic side wall... normal appearing ovaries bilaterally . Small 1 cm left paratubal cyst. Normal appearing uterus. Normal posterior culdesac and normal uterosacral ligaments.    Procedure: the patient was taken to the operating room placed under general anesthesia. Time Out was performed.  She was  Prepped and draped in the normal sterile fashion. A foley catheter was placed. A uterine manipulator was placed. Attention was turned to the abdomen where the umbilicus was injected with 10 cc of marcaine. A 5 mm trocar was placed under direct visualization. Pneumoperitoneum was achieved with C02 gas... A 5 mm trocar was placed in the right and left lower quadrants. Each trocar site was injected with 10 cc of marcaine prior to trocar placement.  Laparoscopic biopsy forceps were used to biopsy the suspected endometrial  implant on the right pelvic side wall. Fulguration and hemostasis was achieved with the Kleppinger. The left paratubal cyst was excised with scissors.  Irrigation was performed. Excellent hemostasis was noted.   The skin incisions were closed with 4-0 vicryl and derma bond. The uterine manipulator was removed.   The patient was taken to the recovery room awake and in stable condition.  Sponge lap and needle counts were correct times 2.

## 2020-09-29 NOTE — H&P (Signed)
Date of Initial H&P: 09/28/2020  History reviewed, patient examined, no change in status, stable for surgery. 

## 2020-09-30 ENCOUNTER — Encounter (HOSPITAL_BASED_OUTPATIENT_CLINIC_OR_DEPARTMENT_OTHER): Payer: Self-pay | Admitting: Obstetrics and Gynecology

## 2020-09-30 LAB — SURGICAL PATHOLOGY

## 2021-10-21 IMAGING — US US PELVIS COMPLETE WITH TRANSVAGINAL
1 series · 14 of 25 positions shown · non-contrast
Comparison: None

CLINICAL DATA: Pelvic pain for 2 months.

EXAM:
TRANSABDOMINAL AND TRANSVAGINAL ULTRASOUND OF PELVIS
TECHNIQUE: Both transabdominal and transvaginal ultrasound examinations of the
pelvis were performed. Transabdominal technique was performed for
global imaging of the pelvis including uterus, ovaries, adnexal
regions, and pelvic cul-de-sac. It was necessary to proceed with
endovaginal exam following the transabdominal exam to visualize the
endometrium and ovaries.

[Series 1: us pelvis complete with transvaginal · 0.26mm/px · 14 of 83 slices shown]
[im 1/83]
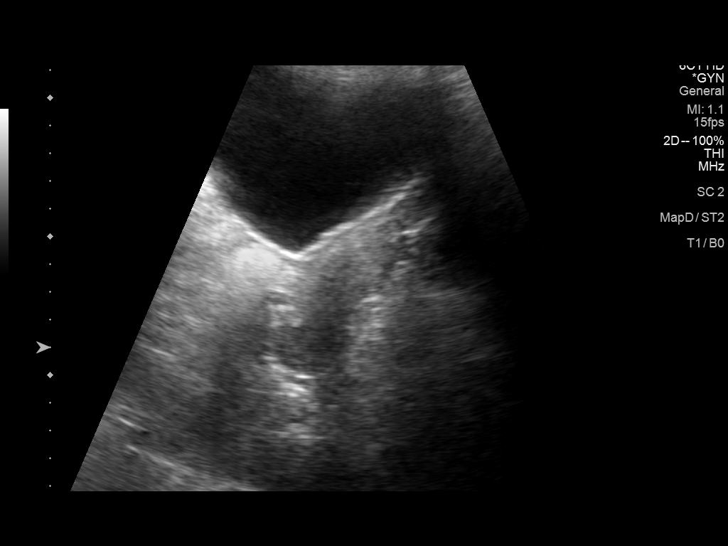
[im 7/83]
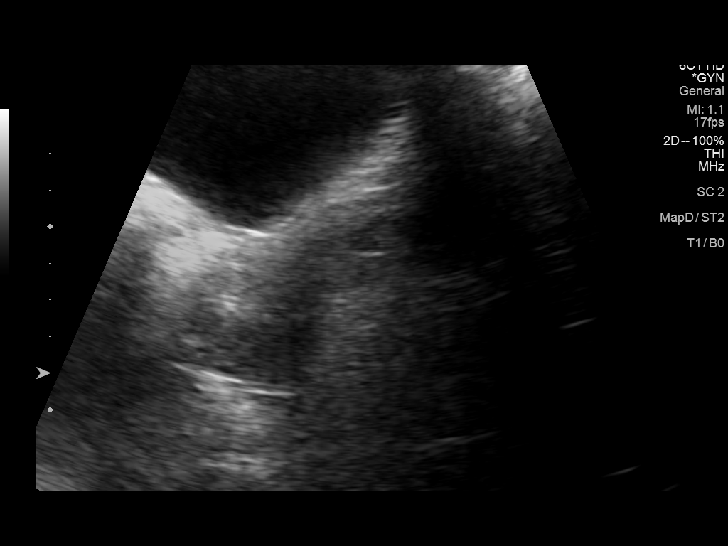
[im 14/83]
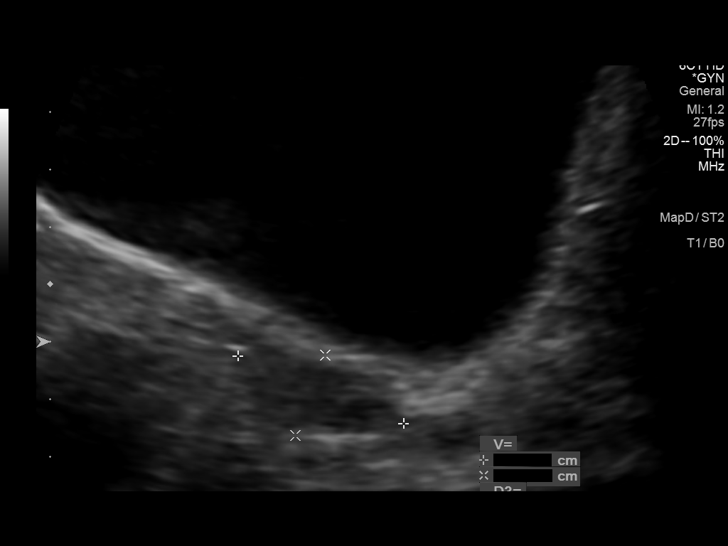
[im 21/83]
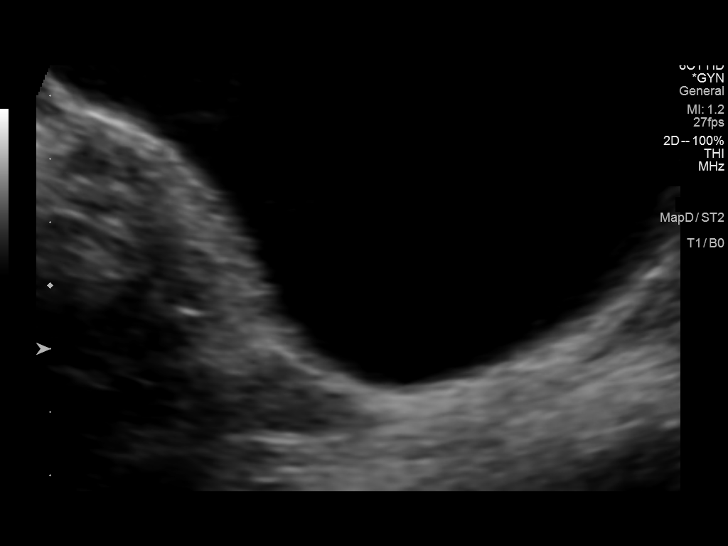
[im 28/83]
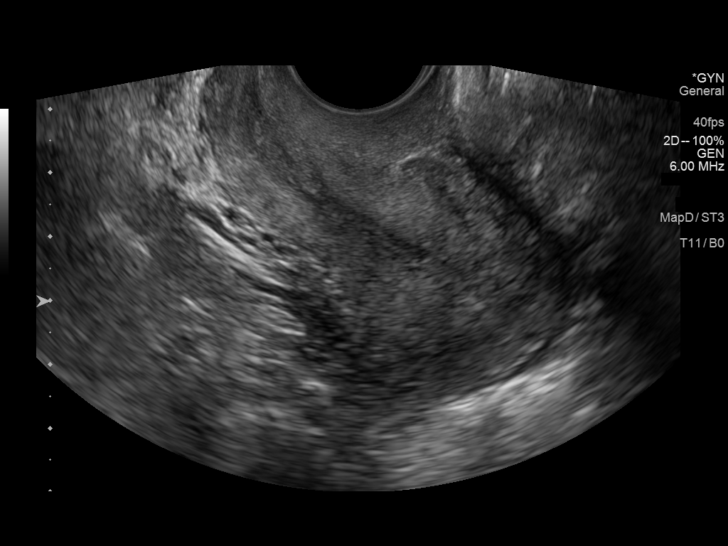
[im 31/83]
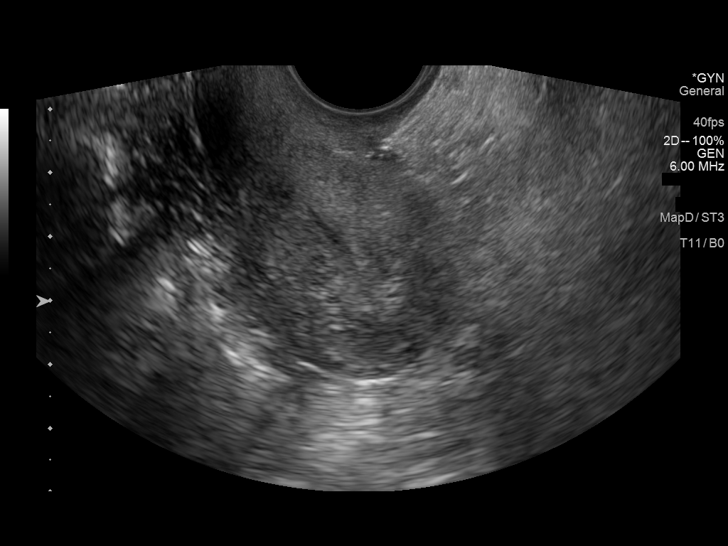
[im 38/83]
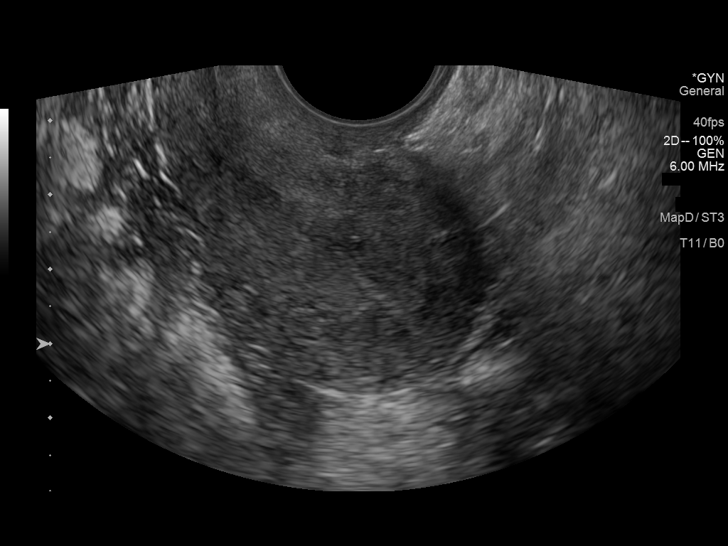
[im 45/83]
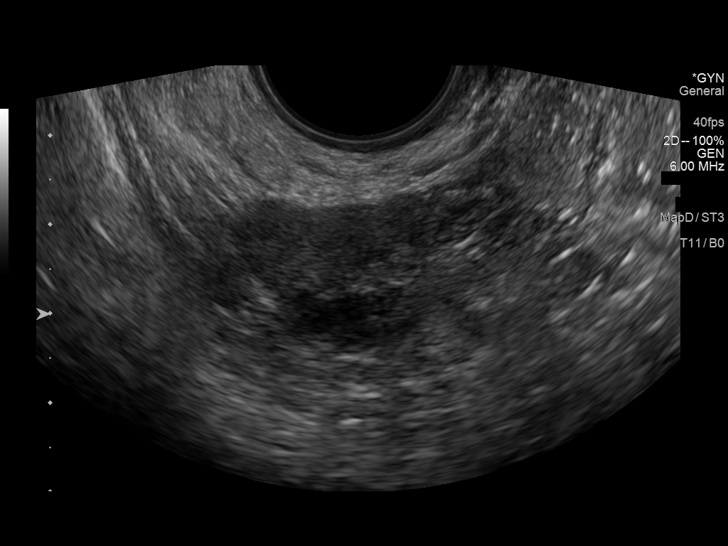
[im 52/83]
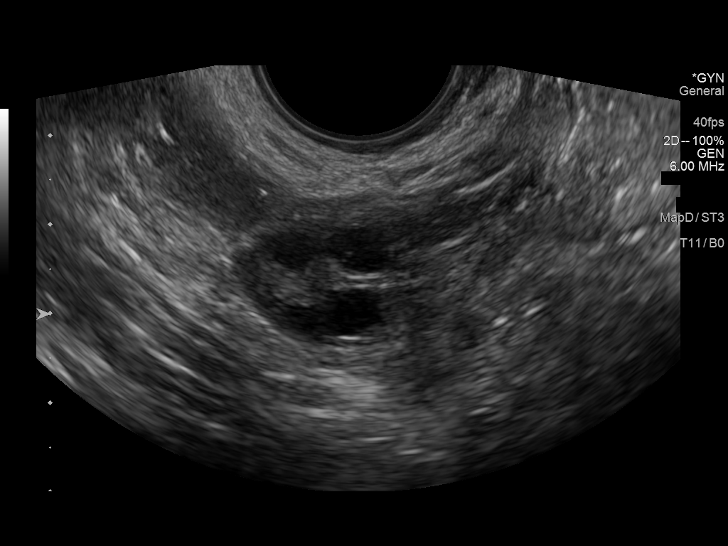
[im 55/83]
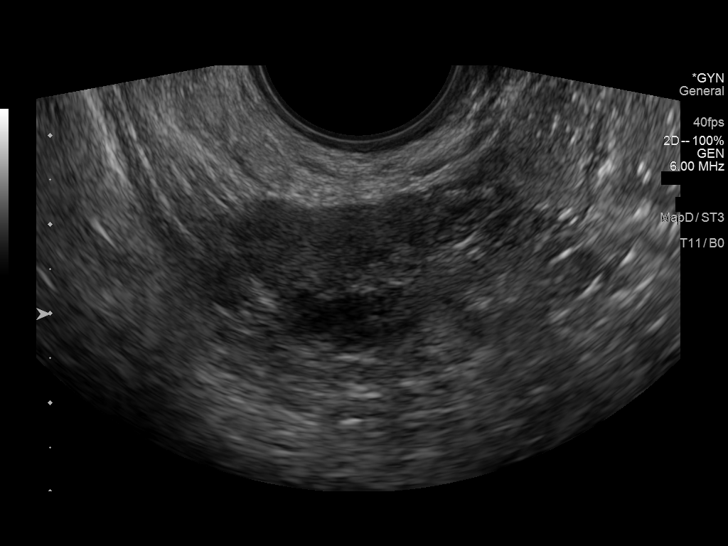
[im 62/83]
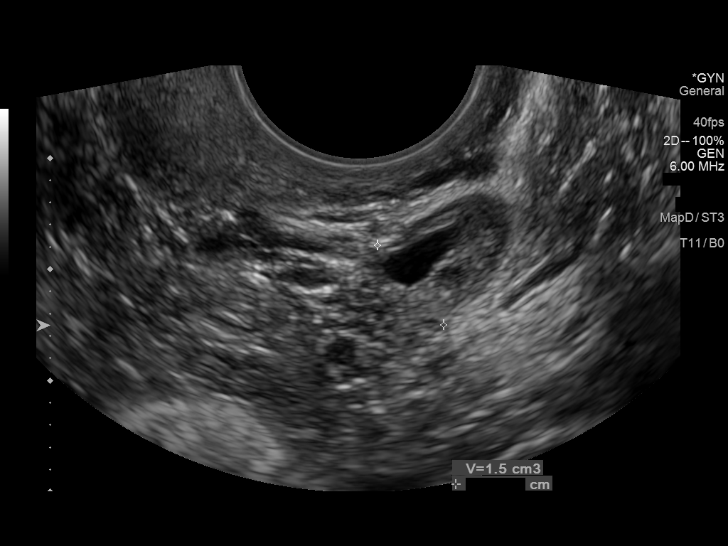
[im 69/83]
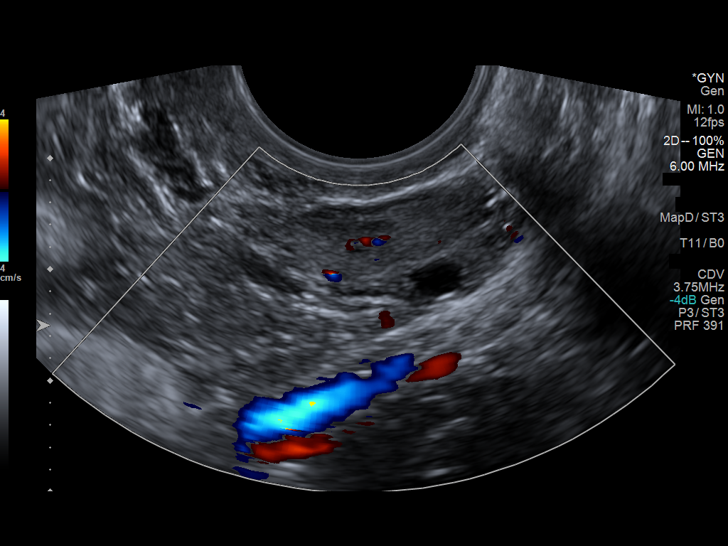
[im 76/83]
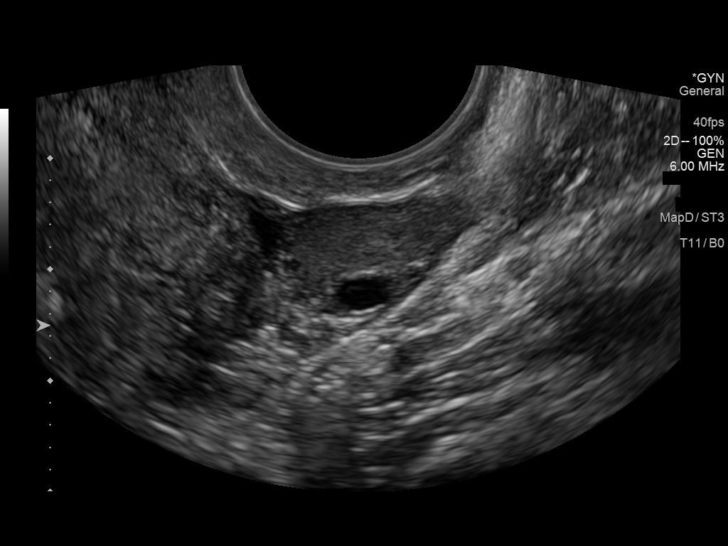
[im 83/83]
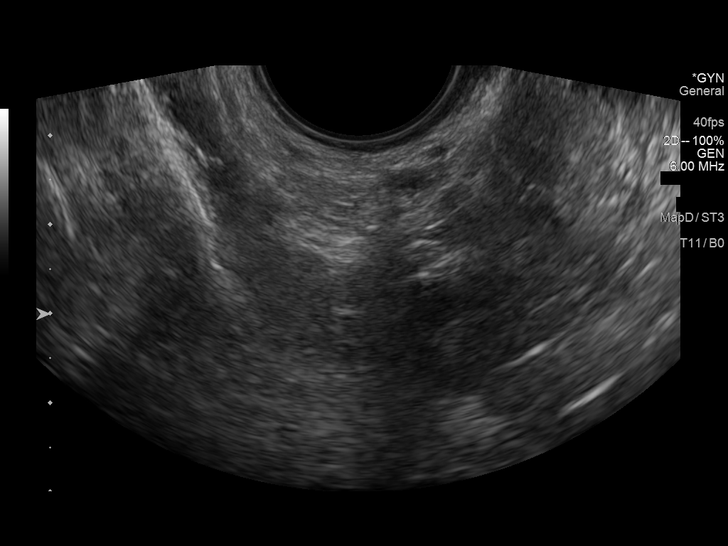

[14 of 25 positions shown; findings below may reference images not displayed]

FINDINGS: Uterus

Measurements: 6.6 x 3.8 x 3.7 cm = volume: 50 mL. Retroverted. No
fibroids or other mass visualized.

Endometrium

Thickness: 4 mm.  No focal abnormality visualized.

Right ovary

Measurements: 3.3 x 1.7 x 1.8 cm = volume: 5.4 mL. Normal
appearance/no adnexal mass.

Left ovary

Measurements: 2.9 x 1.1 x 0.9 cm = volume: 1.5 mL. Normal
appearance/no adnexal mass.

Other findings

No abnormal free fluid.
IMPRESSION: Negative. No pelvic mass or other significant abnormality
identified.

## 2023-06-07 HISTORY — PX: COLONOSCOPY: SHX174

## 2023-12-19 ENCOUNTER — Other Ambulatory Visit: Payer: Self-pay

## 2023-12-19 ENCOUNTER — Encounter (HOSPITAL_BASED_OUTPATIENT_CLINIC_OR_DEPARTMENT_OTHER): Payer: Self-pay | Admitting: Obstetrics & Gynecology

## 2023-12-19 NOTE — Progress Notes (Signed)
 Spoke w/ via phone for pre-op interview---Kelly Mckee needs dos---- none per anesthesia, surgeon orders pending        Mckee results------none COVID test -----patient states asymptomatic no test needed Arrive at -------1100 on Monday, 12/24/23 NPO after MN NO Solid Food.  Clear liquids from MN until---1000 Med rec completed Medications to take morning of surgery -----Cafergot prn Diabetic medication -----n/a Patient instructed no nail polish to be worn day of surgery Patient instructed to bring photo id and insurance card day of surgery Patient aware to have Driver (ride ) / caregiver    for 24 hours after surgery - husband, [name] Patient Special Instructions -----none Pre-Op special Instructions -----Requested orders from Dr. Dannielle via Epic IB on 12/19/23. Patient verbalized understanding of instructions that were given at this phone interview. Patient denies chest pain, sob, fever, cough at the interview.

## 2023-12-20 NOTE — H&P (Signed)
 NIKKO QUAST is an 33 y.o. female G1 with missed abortion measuring [redacted]w[redacted]d.  Blood type O pos.    Pertinent Gynecological History: Menses:  n/a Bleeding: n/a Contraception: none DES exposure: unknown Blood transfusions: none Sexually transmitted diseases: no past history Previous GYN Procedures:  L/S   Last mammogram:  n/a  Date: n/a Last pap: normal Date: 09/12/23 OB History: G1, P0   Menstrual History: Menarche age: n/a Patient's last menstrual period was 09/11/2023 (exact date).    Past Medical History:  Diagnosis Date   ADHD    Anxiety    Depression    Follows w/ Corean Ester, PA with [facility].   Dysthymic disorder    Endometriosis    GERD (gastroesophageal reflux disease)    occasional   Headache    migraines, takes OTC meds   History of self injurious behavior    Ovarian cyst    PONV (postoperative nausea and vomiting)    Wears contact lenses    Wears glasses     Past Surgical History:  Procedure Laterality Date   CHOLECYSTECTOMY N/A 11/28/2018   Procedure: LAPAROSCOPIC CHOLECYSTECTOMY WITH INTRAOPERATIVE CHOLANGIOGRAM;  Surgeon: Curvin Deward MOULD, MD;  Location: [facility] OR;  Service: General;  Laterality: N/A;   COLONOSCOPY  06/07/2023   LAPAROSCOPY N/A 09/29/2020   Procedure: LAPAROSCOPY DIAGNOSTIC WITH BIOPSY OF RT  PELVIC SIDE WALL AND REMOVAL OF PARATUBAL CYST;  Surgeon: Rosalva Sawyer, MD;  Location: [facility];  Service: Gynecology;  Laterality: N/A;   NASAL FRACTURE SURGERY  2004   they set it    WISDOM TOOTH EXTRACTION  2010    History reviewed. No pertinent family history.  Social History:  reports that she has never smoked. She has never used smokeless tobacco. She reports current alcohol use. She reports that she does not currently use drugs.  Allergies:  Allergies  Allergen Reactions   Latex Hives    Patient states that she sometimes does not have a reaction to Latex.    No medications prior to admission.    Review  of Systems  Height 5' 3 (1.6 m), weight 81.6 kg, last menstrual period 09/11/2023. Physical Exam Constitutional:      Appearance: Normal appearance.  HENT:     Head: Normocephalic and atraumatic.  Pulmonary:     Effort: Pulmonary effort is normal.  Abdominal:     Palpations: Abdomen is soft.  Musculoskeletal:        General: Normal range of motion.     Cervical back: Normal range of motion.  Skin:    General: Skin is warm and dry.  Neurological:     Mental Status: She is alert and oriented to person, place, and time.  Psychiatric:        Mood and Affect: Mood normal.        Behavior: Behavior normal.     No results found for this or any previous visit (from the past 24 hours).  No results found.  Assessment/Plan: 33yo G1P0 with missed abortion -Suction D&C with possible genetic studies -Patient is counseled re: risk of bleeding, infection, scarring and damage to surrounding structures.  She is informed of steps of procedure as well as postop expectations and limitations.  All questions were answered and patient wishes to proceed.  Duwaine Blumenthal 12/20/2023, 11:55 AM

## 2023-12-24 ENCOUNTER — Encounter (HOSPITAL_BASED_OUTPATIENT_CLINIC_OR_DEPARTMENT_OTHER): Payer: Self-pay | Admitting: Obstetrics & Gynecology

## 2023-12-24 ENCOUNTER — Ambulatory Visit (HOSPITAL_BASED_OUTPATIENT_CLINIC_OR_DEPARTMENT_OTHER): Payer: Managed Care, Other (non HMO) | Admitting: Anesthesiology

## 2023-12-24 ENCOUNTER — Encounter (HOSPITAL_BASED_OUTPATIENT_CLINIC_OR_DEPARTMENT_OTHER)
Admission: RE | Disposition: A | Discharge status: Home / Self Care | Payer: Self-pay | Source: Home / Self Care | Attending: Obstetrics & Gynecology

## 2023-12-24 ENCOUNTER — Other Ambulatory Visit: Payer: Self-pay

## 2023-12-24 ENCOUNTER — Ambulatory Visit (HOSPITAL_BASED_OUTPATIENT_CLINIC_OR_DEPARTMENT_OTHER)
Admission: RE | Admit: 2023-12-24 | Discharge: 2023-12-24 | Disposition: A | Discharge status: Home / Self Care | Payer: Managed Care, Other (non HMO) | Attending: Obstetrics & Gynecology | Admitting: Obstetrics & Gynecology

## 2023-12-24 DIAGNOSIS — Z01818 Encounter for other preprocedural examination: Secondary | ICD-10-CM

## 2023-12-24 DIAGNOSIS — F418 Other specified anxiety disorders: Secondary | ICD-10-CM

## 2023-12-24 DIAGNOSIS — O021 Missed abortion: Secondary | ICD-10-CM | POA: Diagnosis present

## 2023-12-24 DIAGNOSIS — Z3A01 Less than 8 weeks gestation of pregnancy: Secondary | ICD-10-CM

## 2023-12-24 HISTORY — PX: DILATION AND EVACUATION: SHX1459

## 2023-12-24 HISTORY — DX: Endometriosis, unspecified: N80.9

## 2023-12-24 HISTORY — DX: Presence of spectacles and contact lenses: Z97.3

## 2023-12-24 LAB — CBC
HCT: 37.4 % (ref 36.0–46.0)
Hemoglobin: 12.8 g/dL (ref 12.0–15.0)
MCH: 33.2 pg (ref 26.0–34.0)
MCHC: 34.2 g/dL (ref 30.0–36.0)
MCV: 96.9 fL (ref 80.0–100.0)
Platelets: 264 10*3/uL (ref 150–400)
RBC: 3.86 MIL/uL — ABNORMAL LOW (ref 3.87–5.11)
RDW: 11.3 % — ABNORMAL LOW (ref 11.5–15.5)
WBC: 7 10*3/uL (ref 4.0–10.5)
nRBC: 0 % (ref 0.0–0.2)

## 2023-12-24 LAB — ABO/RH: ABO/RH(D): O POS

## 2023-12-24 LAB — TYPE AND SCREEN
ABO/RH(D): O POS
Antibody Screen: NEGATIVE

## 2023-12-24 SURGERY — DILATION AND EVACUATION, UTERUS
Anesthesia: General | Site: Vagina

## 2023-12-24 MED ORDER — MIDAZOLAM HCL 5 MG/5ML IJ SOLN
INTRAMUSCULAR | Status: DC | PRN
  Administered 2023-12-24: 2 mg via INTRAVENOUS

## 2023-12-24 MED ORDER — DOXYCYCLINE HYCLATE 100 MG IV SOLR
100.0000 mg | Freq: Once | INTRAVENOUS | Status: AC
  Administered 2023-12-24: 100 mg via INTRAVENOUS
  Filled 2023-12-24: qty 100

## 2023-12-24 MED ORDER — FENTANYL CITRATE (PF) 100 MCG/2ML IJ SOLN
25.0000 ug | INTRAMUSCULAR | Status: DC | PRN
  Administered 2023-12-24: 50 ug via INTRAVENOUS

## 2023-12-24 MED ORDER — DOXYCYCLINE HYCLATE 100 MG PO TABS
ORAL_TABLET | ORAL | Status: AC
  Filled 2023-12-24: qty 2

## 2023-12-24 MED ORDER — ONDANSETRON HCL 4 MG/2ML IJ SOLN
INTRAMUSCULAR | Status: DC | PRN
  Administered 2023-12-24: 4 mg via INTRAVENOUS

## 2023-12-24 MED ORDER — DEXAMETHASONE SODIUM PHOSPHATE 10 MG/ML IJ SOLN
INTRAMUSCULAR | Status: DC | PRN
  Administered 2023-12-24: 10 mg via INTRAVENOUS

## 2023-12-24 MED ORDER — LACTATED RINGERS IV SOLN
INTRAVENOUS | Status: DC
Start: 2023-12-24 — End: 2023-12-24

## 2023-12-24 MED ORDER — OXYCODONE-ACETAMINOPHEN 5-325 MG PO TABS: 1.0000 | ORAL_TABLET | ORAL | 0 refills | Status: AC | PRN

## 2023-12-24 MED ORDER — ACETAMINOPHEN 500 MG PO TABS
ORAL_TABLET | ORAL | Status: AC
  Filled 2023-12-24: qty 2

## 2023-12-24 MED ORDER — LIDOCAINE 2% (20 MG/ML) 5 ML SYRINGE
INTRAMUSCULAR | Status: DC | PRN
  Administered 2023-12-24: 80 mg via INTRAVENOUS

## 2023-12-24 MED ORDER — IBUPROFEN 600 MG PO TABS: 600.0000 mg | ORAL_TABLET | Freq: Four times a day (QID) | ORAL | 0 refills | Status: AC | PRN

## 2023-12-24 MED ORDER — MIDAZOLAM HCL 2 MG/2ML IJ SOLN
INTRAMUSCULAR | Status: AC
  Filled 2023-12-24: qty 2

## 2023-12-24 MED ORDER — PROPOFOL 10 MG/ML IV BOLUS
INTRAVENOUS | Status: AC
  Filled 2023-12-24: qty 20

## 2023-12-24 MED ORDER — FENTANYL CITRATE (PF) 100 MCG/2ML IJ SOLN
INTRAMUSCULAR | Status: DC | PRN
  Administered 2023-12-24: 100 ug via INTRAVENOUS

## 2023-12-24 MED ORDER — DEXMEDETOMIDINE HCL IN NACL 80 MCG/20ML IV SOLN
INTRAVENOUS | Status: DC | PRN
  Administered 2023-12-24 (×2): 4 ug via INTRAVENOUS

## 2023-12-24 MED ORDER — FENTANYL CITRATE (PF) 100 MCG/2ML IJ SOLN
INTRAMUSCULAR | Status: AC
  Filled 2023-12-24: qty 2

## 2023-12-24 MED ORDER — SODIUM CHLORIDE 0.9 % IV SOLN
INTRAVENOUS | Status: DC
  Administered 2023-12-24: 500 mL via INTRAVENOUS

## 2023-12-24 MED ORDER — PROPOFOL 10 MG/ML IV BOLUS
INTRAVENOUS | Status: DC | PRN
  Administered 2023-12-24: 160 mg via INTRAVENOUS

## 2023-12-24 MED ORDER — LIDOCAINE HCL (PF) 2 % IJ SOLN
INTRAMUSCULAR | Status: AC
  Filled 2023-12-24: qty 5

## 2023-12-24 MED ORDER — POVIDONE-IODINE 10 % EX SWAB: 2.0000 | Freq: Once | CUTANEOUS | Status: DC

## 2023-12-24 MED ORDER — DOXYCYCLINE HYCLATE 100 MG PO TABS
ORAL_TABLET | ORAL | Status: AC
  Filled 2023-12-24: qty 1

## 2023-12-24 MED ORDER — ACETAMINOPHEN 500 MG PO TABS
1000.0000 mg | ORAL_TABLET | Freq: Once | ORAL | Status: AC
Start: 2023-12-24 — End: 2023-12-24
  Administered 2023-12-24: 1000 mg via ORAL

## 2023-12-24 MED ORDER — KETOROLAC TROMETHAMINE 30 MG/ML IJ SOLN
INTRAMUSCULAR | Status: DC | PRN
  Administered 2023-12-24: 30 mg via INTRAVENOUS

## 2023-12-24 MED ORDER — SUCCINYLCHOLINE CHLORIDE 200 MG/10ML IV SOSY
PREFILLED_SYRINGE | INTRAVENOUS | Status: AC
  Filled 2023-12-24: qty 10

## 2023-12-24 MED ORDER — LACTATED RINGERS IV SOLN: INTRAVENOUS | Status: DC

## 2023-12-24 MED ORDER — DOXYCYCLINE HYCLATE 100 MG PO TABS
200.0000 mg | ORAL_TABLET | Freq: Once | ORAL | Status: AC
  Administered 2023-12-24: 200 mg via ORAL

## 2023-12-24 MED ORDER — 0.9 % SODIUM CHLORIDE (POUR BTL) OPTIME
TOPICAL | Status: DC | PRN
  Administered 2023-12-24: 500 mL

## 2023-12-24 SURGICAL SUPPLY — 23 items
CATH ROBINSON RED A/P 16FR (CATHETERS) ×1 IMPLANT
CATH SILICONE 16FRX5CC (CATHETERS) ×1 IMPLANT
DRSG TELFA 3X8 NADH STRL (GAUZE/BANDAGES/DRESSINGS) ×2 IMPLANT
FILTER UTR ASPR ASSEMBLY (MISCELLANEOUS) ×2 IMPLANT
GAUZE 4X4 16PLY ~~LOC~~+RFID DBL (SPONGE) ×2 IMPLANT
GLOVE BIO SURGEON STRL SZ 6 (GLOVE) ×4 IMPLANT
GLOVE BIOGEL PI IND STRL 6 (GLOVE) ×2 IMPLANT
GLOVE ECLIPSE 6.0 STRL STRAW (GLOVE) ×4 IMPLANT
GLOVE SS PI 5.5 STRL (GLOVE) ×2 IMPLANT
GOWN STRL REUS W/TWL LRG LVL3 (GOWN DISPOSABLE) ×2 IMPLANT
HOSE CONNECTING 18IN BERKELEY (TUBING) ×2 IMPLANT
IV NS 500ML BAXH (IV SOLUTION) ×1 IMPLANT
KIT BERKELEY 1ST TRIMESTER 3/8 (MISCELLANEOUS) ×6 IMPLANT
KIT TURNOVER CYSTO (KITS) ×2 IMPLANT
PACK VAGINAL MINOR WOMEN LF (CUSTOM PROCEDURE TRAY) ×2 IMPLANT
SET BERKELEY SUCTION TUBING (SUCTIONS) ×2 IMPLANT
SLEEVE SCD COMPRESS KNEE MED (STOCKING) ×2 IMPLANT
TOWEL OR 17X24 6PK STRL BLUE (TOWEL DISPOSABLE) ×2 IMPLANT
VACURETTE 6 ASPIR F TIP BERK (CANNULA) IMPLANT
VACURETTE 7MM CVD STRL WRAP (CANNULA) ×1 IMPLANT
VACURETTE 8 RIGID CVD (CANNULA) IMPLANT
VACURETTE 9 RIGID CVD (CANNULA) IMPLANT
WATER STERILE IRR 500ML POUR (IV SOLUTION) ×1 IMPLANT

## 2023-12-24 NOTE — Discharge Instructions (Addendum)
 Call MD for T>100.4, heavy vaginal bleeding, severe abdominal pain, intractable nausea and/or vomiting, or respiratory distress.  Call office to scheduled postop visit in 2 weeks.  Pelvic rest.   Post Anesthesia Home Care Instructions  Activity: Get plenty of rest for the remainder of the day. A responsible adult should stay with you for 24 hours following the procedure.  For the next 24 hours, DO NOT: -Drive a car -Advertising copywriter -Drink alcoholic beverages -Take any medication unless instructed by your physician -Make any legal decisions or sign important papers.  Meals: Start with liquid foods such as gelatin or soup. Progress to regular foods as tolerated. Avoid greasy, spicy, heavy foods. If nausea and/or vomiting occur, drink only clear liquids until the nausea and/or vomiting subsides. Call your physician if vomiting continues.  Special Instructions/Symptoms: Your throat may feel dry or sore from the anesthesia or the breathing tube placed in your throat during surgery. If this causes discomfort, gargle with warm salt water. The discomfort should disappear within 24 hours.

## 2023-12-24 NOTE — Anesthesia Procedure Notes (Signed)
 Procedure Name: LMA Insertion Date/Time: 12/24/2023 1:08 PM  Performed by: Jayleon Mcfarlane D, CRNAPre-anesthesia Checklist: Patient identified, Emergency Drugs available, Suction available and Patient being monitored Patient Re-evaluated:Patient Re-evaluated prior to induction Oxygen Delivery Method: Circle system utilized Preoxygenation: Pre-oxygenation with 100% oxygen Induction Type: IV induction Ventilation: Mask ventilation without difficulty LMA: LMA inserted LMA Size: 4.0 Tube type: Oral Number of attempts: 1 Placement Confirmation: positive ETCO2 and breath sounds checked- equal and bilateral Tube secured with: Tape Dental Injury: Teeth and Oropharynx as per pre-operative assessment

## 2023-12-24 NOTE — Op Note (Signed)
 PREOPERATIVE DIAGNOSIS:  33 y.o. with missed abortion   POSTOPERATIVE DIAGNOSIS: The same   PROCEDURE: Suction D&C    SURGEON:  Dr. Duwaine Blumenthal   INDICATIONS: 33 y.o. G1P0  here for scheduled surgery for removal of missed abortion measuring 6 weeks.   Risks of surgery were discussed with the patient including but not limited to: bleeding which may require transfusion; infection which may require antibiotics; injury to uterus or surrounding organs; intrauterine scarring which may impair future fertility; need for additional procedures including laparotomy or laparoscopy; and other postoperative/anesthesia complications. Written informed consent was obtained.     FINDINGS:  An 8 week size uterus.     ANESTHESIA:   General   ESTIMATED BLOOD LOSS:  Less than 20 ml   SPECIMENS: POC sent to pathology   COMPLICATIONS:  None immediate.   PROCEDURE DETAILS:  The patient received intravenous antibiotics while in the preoperative area.  She was then taken to the operating room where general anesthesia was administered and was found to be adequate.  After an adequate timeout was performed, she was placed in the dorsal lithotomy position and examined.  She was then prepped and draped in the sterile manner.   Her bladder was catheterized for an unmeasured amount of clear, yellow urine. A speculum was then placed in the patient's vagina and a single tooth tenaculum was applied to the anterior lip of the cervix.  The uteus was sounded to 9 cm and dilated manually to 21 Fr with metal dilators.  Once the cervix was dilated, the 7 mm suction curette was introduced into the uterus.  3 passes were taken using suction..  A sharp curettage was then performed and a gritty texture was appreciated circumferentially.  The tenaculum was removed from the anterior lip of the cervix and the vaginal speculum was removed after noting good hemostasis.  The patient tolerated the procedure well and was taken to the recovery area  awake, extubated and in stable condition.

## 2023-12-24 NOTE — Transfer of Care (Signed)
 Immediate Anesthesia Transfer of Care Note  Patient: Kelly Mckee  Procedure(s) Performed: DILATATION AND EVACUATION (Vagina ) CHROMOSOME STUDIES (Vagina )  Patient Location: PACU  Anesthesia Type:General  Level of Consciousness: awake, alert , and oriented  Airway & Oxygen Therapy: Patient Spontanous Breathing and Patient connected to nasal cannula oxygen  Post-op Assessment: Report given to RN and Post -op Vital signs reviewed and stable  Post vital signs: Reviewed and stable  Last Vitals:  Vitals Value Taken Time  BP    Temp    Pulse 76 12/24/23 1337  Resp 15 12/24/23 1338  SpO2 99 % 12/24/23 1337  Vitals shown include unfiled device data.  Last Pain:  Vitals:   12/24/23 1119  TempSrc: Oral      Patients Stated Pain Goal: 6 (12/24/23 1119)  Complications: No notable events documented.

## 2023-12-24 NOTE — Anesthesia Postprocedure Evaluation (Signed)
 Anesthesia Post Note  Patient: Kelly Mckee  Procedure(s) Performed: DILATATION AND EVACUATION (Vagina )     Patient location during evaluation: PACU Anesthesia Type: General Level of consciousness: awake and alert Pain management: pain level controlled Vital Signs Assessment: post-procedure vital signs reviewed and stable Respiratory status: spontaneous breathing, nonlabored ventilation and respiratory function stable Cardiovascular status: blood pressure returned to baseline and stable Postop Assessment: no apparent nausea or vomiting Anesthetic complications: no  No notable events documented.  Last Vitals:  Vitals:   12/24/23 1416 12/24/23 1426  BP: 103/65   Pulse: 85   Resp: 14   Temp:  36.6 C  SpO2: 97%     Last Pain:  Vitals:   12/24/23 1416  TempSrc:   PainSc: 0-No pain                 Lular Letson,W. EDMOND

## 2023-12-24 NOTE — Anesthesia Preprocedure Evaluation (Addendum)
 Anesthesia Evaluation  Patient identified by MRN, date of birth, ID band Patient awake    Reviewed: Allergy & Precautions, H&P , NPO status , Patient's Chart, lab work & pertinent test results  History of Anesthesia Complications (+) PONV and history of anesthetic complications  Airway Mallampati: II  TM Distance: >3 FB     Dental no notable dental hx. (+) Teeth Intact, Dental Advisory Given   Pulmonary neg pulmonary ROS   Pulmonary exam normal breath sounds clear to auscultation       Cardiovascular negative cardio ROS  Rhythm:Regular Rate:Normal     Neuro/Psych  Headaches  Anxiety Depression       GI/Hepatic Neg liver ROS,GERD  ,,  Endo/Other  negative endocrine ROS    Renal/GU negative Renal ROS  negative genitourinary   Musculoskeletal   Abdominal   Peds  Hematology negative hematology ROS (+)   Anesthesia Other Findings   Reproductive/Obstetrics negative OB ROS                             Anesthesia Physical Anesthesia Plan  ASA: 2  Anesthesia Plan: General   Post-op Pain Management: Tylenol  PO (pre-op)* and Toradol  IV (intra-op)*   Induction: Intravenous  PONV Risk Score and Plan: 4 or greater and Ondansetron , Dexamethasone  and Midazolam   Airway Management Planned: LMA  Additional Equipment:   Intra-op Plan:   Post-operative Plan: Extubation in OR  Informed Consent: I have reviewed the patients History and Physical, chart, labs and discussed the procedure including the risks, benefits and alternatives for the proposed anesthesia with the patient or authorized representative who has indicated his/her understanding and acceptance.     Dental advisory given  Plan Discussed with: CRNA  Anesthesia Plan Comments:        Anesthesia Quick Evaluation

## 2023-12-24 NOTE — Progress Notes (Signed)
 No change to H&P. Patient declines Anora (genetic testing).  Mitchel Honour, DO

## 2023-12-25 ENCOUNTER — Encounter (HOSPITAL_BASED_OUTPATIENT_CLINIC_OR_DEPARTMENT_OTHER): Payer: Self-pay | Admitting: Obstetrics & Gynecology

## 2023-12-25 LAB — SURGICAL PATHOLOGY
